# Patient Record
Sex: Male | Born: 1965 | Hispanic: No | Marital: Married | Smoking: Never smoker
Health system: Southern US, Community
[De-identification: ages and names within clinical notes are randomized; demographics above are authoritative.]

## PROBLEM LIST (undated history)

## (undated) DIAGNOSIS — F329 Major depressive disorder, single episode, unspecified: Secondary | ICD-10-CM

## (undated) DIAGNOSIS — F32A Depression, unspecified: Secondary | ICD-10-CM

## (undated) HISTORY — DX: Depression, unspecified: F32.A

## (undated) HISTORY — DX: Major depressive disorder, single episode, unspecified: F32.9

---

## 1999-12-21 ENCOUNTER — Encounter: Payer: Self-pay | Admitting: Emergency Medicine

## 1999-12-21 ENCOUNTER — Emergency Department (HOSPITAL_COMMUNITY): Admission: EM | Admit: 1999-12-21 | Discharge: 1999-12-21 | Payer: Self-pay | Admitting: Emergency Medicine

## 2002-11-21 ENCOUNTER — Emergency Department (HOSPITAL_COMMUNITY): Admission: EM | Admit: 2002-11-21 | Discharge: 2002-11-21 | Payer: Self-pay | Admitting: Emergency Medicine

## 2006-10-25 ENCOUNTER — Emergency Department (HOSPITAL_COMMUNITY): Admission: EM | Admit: 2006-10-25 | Discharge: 2006-10-25 | Payer: Self-pay | Admitting: Emergency Medicine

## 2006-11-05 ENCOUNTER — Ambulatory Visit (HOSPITAL_COMMUNITY): Admission: RE | Admit: 2006-11-05 | Discharge: 2006-11-05 | Payer: Self-pay | Admitting: Chiropractic Medicine

## 2007-10-21 ENCOUNTER — Emergency Department (HOSPITAL_COMMUNITY): Admission: EM | Admit: 2007-10-21 | Discharge: 2007-10-21 | Payer: Self-pay | Admitting: Emergency Medicine

## 2010-05-16 ENCOUNTER — Ambulatory Visit (INDEPENDENT_AMBULATORY_CARE_PROVIDER_SITE_OTHER): Payer: Self-pay | Admitting: Family Medicine

## 2010-05-16 ENCOUNTER — Encounter: Payer: Self-pay | Admitting: Family Medicine

## 2010-05-16 ENCOUNTER — Other Ambulatory Visit: Payer: Self-pay | Admitting: Family Medicine

## 2010-05-16 DIAGNOSIS — D229 Melanocytic nevi, unspecified: Secondary | ICD-10-CM | POA: Insufficient documentation

## 2010-05-16 DIAGNOSIS — F32A Depression, unspecified: Secondary | ICD-10-CM | POA: Insufficient documentation

## 2010-05-16 DIAGNOSIS — Z0000 Encounter for general adult medical examination without abnormal findings: Secondary | ICD-10-CM

## 2010-05-16 DIAGNOSIS — F329 Major depressive disorder, single episode, unspecified: Secondary | ICD-10-CM

## 2010-05-16 DIAGNOSIS — D492 Neoplasm of unspecified behavior of bone, soft tissue, and skin: Secondary | ICD-10-CM

## 2010-05-16 DIAGNOSIS — K029 Dental caries, unspecified: Secondary | ICD-10-CM | POA: Insufficient documentation

## 2010-05-16 NOTE — Assessment & Plan Note (Signed)
Suspicious mole on back.   Procedure note:   After discussing risks/ benefits of punch biopsy of mole, patient gave verbal and written consent to proceed.  A 4mm punch biopsy was obtained using 1% lidocaine with epinephrine, area prepped in clean fashion.  The 4mm punch was applied, specimen placed in formalin.  Excellent hemostasis and cosmesis.  Dressed with triple antibiotic and patient given after-care instructions.  Tolerated procedure well.  Will follow up with him by phone with results when they are available.
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
## 2010-05-16 NOTE — Assessment & Plan Note (Signed)
Given contact info for free dental clinic to be held in [place] next month.  Alternatively, he needs to apply for [employer] and get enrolled in dental clinic through [facility].

## 2010-05-16 NOTE — Patient Instructions (Signed)
Fue un placer conocerle como paciente hoy!  Por favor, vuelva en algun dia de la semana que viene para hacerse los laboratorios en ayunas (despues de 8 horas sin ni comer ni tomar nada).  Saque' una biopsia de la piel hoy; estoy mandandola para Chiropractor, despues le hago contacto con los [employer].  Informacion sobre la salud mental: [facility] [place] [phone], tienen atencion en espanol  Para atencion dental, hay una clinica gratuita en la [facility] [address], [place], [place] [address]  Quiero que haga una cita conmigo en 4 a 6 semanas.  MAKE FOLLOW UP APPT IN 4 TO 6 WEEKS WITH DR Mauricio Po

## 2010-05-20 ENCOUNTER — Telehealth: Payer: Self-pay | Admitting: Family Medicine

## 2010-05-20 NOTE — Telephone Encounter (Signed)
Called home, referred to new work #. Left message with Naval architect Fayrene Fearing.  I wish to tell pt that the skin biopsy shows some atypia, would like to do a more extensive excision of the mole.

## 2010-05-22 ENCOUNTER — Telehealth: Payer: Self-pay | Admitting: Family Medicine

## 2010-05-22 ENCOUNTER — Other Ambulatory Visit: Payer: Self-pay

## 2010-05-22 ENCOUNTER — Encounter: Payer: Self-pay | Admitting: Family Medicine

## 2010-05-22 NOTE — Telephone Encounter (Signed)
Attempted to call patient at home number to discuss abnormal skin biopsy results.  Automated message that "customer is unavailable", unable to leave message.  Will send letter informing patient that he needs wide-excision in the office due to atypia on punch bx.

## 2010-05-23 ENCOUNTER — Other Ambulatory Visit: Payer: Self-pay | Admitting: *Deleted

## 2010-05-23 ENCOUNTER — Other Ambulatory Visit: Payer: Self-pay

## 2010-05-23 DIAGNOSIS — Z0000 Encounter for general adult medical examination without abnormal findings: Secondary | ICD-10-CM

## 2010-05-23 DIAGNOSIS — F329 Major depressive disorder, single episode, unspecified: Secondary | ICD-10-CM

## 2010-05-23 LAB — COMPREHENSIVE METABOLIC PANEL
Albumin: 4.5 g/dL (ref 3.5–5.2)
BUN: 14 mg/dL (ref 6–23)
CO2: 26 mEq/L (ref 19–32)
Glucose, Bld: 95 mg/dL (ref 70–99)
Potassium: 4.4 mEq/L (ref 3.5–5.3)
Sodium: 139 mEq/L (ref 135–145)
Total Bilirubin: 0.7 mg/dL (ref 0.3–1.2)
Total Protein: 7.4 g/dL (ref 6.0–8.3)

## 2010-05-23 LAB — CONVERTED CEMR LAB
AST: 21 units/L (ref 0–37)
Alkaline Phosphatase: 68 units/L (ref 39–117)
BUN: 14 mg/dL (ref 6–23)
Creatinine, Ser: 0.74 mg/dL (ref 0.40–1.50)
HCT: 42.9 % (ref 39.0–52.0)
HDL: 46 mg/dL (ref >39)
Hemoglobin: 14.7 g/dL (ref 13.0–17.0)
LDL Cholesterol: 137 mg/dL — ABNORMAL HIGH (ref 0–99)
MCHC: 34.3 g/dL (ref 30.0–36.0)
RDW: 13.7 % (ref 11.5–15.5)
TSH: 1.025 microintl units/mL (ref 0.350–4.500)
Total Bilirubin: 0.7 mg/dL (ref 0.3–1.2)
Total CHOL/HDL Ratio: 4.6
VLDL: 27 mg/dL (ref 0–40)

## 2010-05-23 LAB — CBC
HCT: 42.9 % (ref 39.0–52.0)
Hemoglobin: 14.7 g/dL (ref 13.0–17.0)
MCHC: 34.3 g/dL (ref 30.0–36.0)
MCV: 84.1 fL (ref 78.0–100.0)

## 2010-05-23 LAB — LIPID PANEL
Cholesterol: 210 mg/dL — ABNORMAL HIGH (ref 0–200)
HDL: 46 mg/dL (ref >39)
Triglycerides: 133 mg/dL (ref <150)

## 2010-06-12 ENCOUNTER — Telehealth: Payer: Self-pay | Admitting: Family Medicine

## 2010-06-12 NOTE — Telephone Encounter (Signed)
Patient gives verbal permission to discuss his health record with his wife, Darryl Wang.  He works in [employer] and is not allowed to receive phone calls at work.  Best way to reach is to call home phone and discuss with wife.

## 2010-06-12 NOTE — Telephone Encounter (Signed)
Patient seen in office while accompanying wife in [facility].  Conversation in [place].  Discussed lab results, also discussed atypia on punch biopsy and need for excisional biopsy with margins.  He is concerned about cost, has not gone for [employer] card yet, plans to go tomorrow and then schedule for me on the week of March 26th.  I have informed him that I will be away for the first 2 weeks of April, may schedule with another physician during that time if he cannot come in during the last week of March.

## 2010-06-13 ENCOUNTER — Ambulatory Visit: Payer: Self-pay | Admitting: Family Medicine

## 2010-06-27 ENCOUNTER — Encounter: Payer: Self-pay | Admitting: Family Medicine

## 2010-06-27 ENCOUNTER — Ambulatory Visit (INDEPENDENT_AMBULATORY_CARE_PROVIDER_SITE_OTHER): Payer: Self-pay | Admitting: Family Medicine

## 2010-06-27 ENCOUNTER — Other Ambulatory Visit: Payer: Self-pay | Admitting: Family Medicine

## 2010-06-27 VITALS — BP 118/78 | HR 68 | Wt 146.0 lb

## 2010-06-27 DIAGNOSIS — D492 Neoplasm of unspecified behavior of bone, soft tissue, and skin: Secondary | ICD-10-CM

## 2010-06-27 DIAGNOSIS — D229 Melanocytic nevi, unspecified: Secondary | ICD-10-CM

## 2010-06-27 NOTE — Progress Notes (Signed)
  Subjective:    Patient ID: Darryl Wang, male    DOB: 1965-07-10, 45 y.o.   MRN: [id number]  HPI Visit conducted in Spanish. Patient and wife present.  Discussed results of last punch biopsy, which has healed nicely.  Discussed reasons for recommending full excision with elliptical excision today.     Review of Systems  Allergic to PCN only.  No latex or other allergy.  No blood dyscrasia or coagulopathy.    Objective:   Physical Exam Well appearing, no apparent distress.  SKIN: healed area of punch biopsy on upper back, to R of midline.  Healed scar, surrounded by small area of hyperpigmentation and erythema.         Assessment & Plan:

## 2010-06-27 NOTE — Patient Instructions (Signed)
Fue un placer verle hoy para la biopsia del lunar en la espalda.  El procedimiento salio' bien.   Es importante que vuelva en 10 a 14 dias con la enfermera para quitarle las 5 puntadas en la espalda.   Por favor no se quite la benda hasta manana por la tarde.  No se moje ni se bane antes de dejar pasar las 24 horas.   Puede dejar el area abierta si esta' seco.   Llame en seguida si ve purulencia, si esta' muy rojizo o [employer], o si tiene calenturas o fiebres.   Puede volver al Aleen Campi de nuevo el domingo (1 de abril).  Como no voy a Investment banker, corporate oficina por 2 semanas, pido que llame en 10 dias para saber los Baker.  De otra forma, cuando yo regrese, le llamo con los [place].

## 2010-06-27 NOTE — Assessment & Plan Note (Addendum)
Procedure Note: Discussed risks/benefits with patient, wife.  Patient agrees to proceed after given chance to ask  Questions.   2% lido with epinephrine applied to area.  Markings made at site of elliptical incision.  Area prepped in sterile fashion.   11-blade used to make a 1.25cm by 8mm elliptical incision conforming to anatomic skin contour lines.  EBL 0.  4-0 Vicryl suture, 5 interrupted sutures to close.  Five (5) sutures placed along length of the incision site, with good closure and no dog ears.  Dressed with triple antibiotic ointment and gauze.  After care instructions given.  For suture removal in 10-14 days by RN clinic.  Patient instructed to call for results after 1 week, or else I will call patient's house with results when I return from my leave.

## 2010-07-02 ENCOUNTER — Ambulatory Visit (INDEPENDENT_AMBULATORY_CARE_PROVIDER_SITE_OTHER): Payer: Self-pay | Admitting: Family Medicine

## 2010-07-02 VITALS — BP 98/62 | Temp 98.1°F | Wt 140.0 lb

## 2010-07-02 DIAGNOSIS — T8130XA Disruption of wound, unspecified, initial encounter: Secondary | ICD-10-CM | POA: Insufficient documentation

## 2010-07-02 DIAGNOSIS — T8131XA Disruption of external operation (surgical) wound, not elsewhere classified, initial encounter: Secondary | ICD-10-CM

## 2010-07-02 NOTE — Progress Notes (Signed)
Darryl Wang Had a biopsy of his upper back this Friday. This week his stitches came out in the inferior portion of his wound. His wife cleaned the wound and applied a steri-strip and a dressing. He is doing well. No drainage or surrounding erythremia. No fever.   ROS: As above.  Exam: VS reviewed.  Skin: Back: Small 1 cm incision on upper right back. Top 4 mm has black sutures in place. Inferior portion partially dehisced with steri strip overlaying. Wound is healing. No surrounding erythremia. Not tender. No drainage.

## 2010-07-02 NOTE — Assessment & Plan Note (Signed)
Minor. Wife is doing a great job with management.  Plan: Reassurance and warning signs given. Feel trying to replace sutures at this time would only create more of a scar and greater chance of infection. Continue steri strip as needed. Will f/u in 1 week for removal of rest of the sutures.

## 2010-07-10 ENCOUNTER — Ambulatory Visit: Payer: Self-pay | Admitting: *Deleted

## 2010-07-10 DIAGNOSIS — Z4802 Encounter for removal of sutures: Secondary | ICD-10-CM

## 2010-07-10 NOTE — Progress Notes (Signed)
In for suture removal.  Three sutures removed. Wound completely healed. No redness or drainage noted. Area cleaned with alcohol and antibiotic ointment applied where sutures were removed. covered with bandaid.

## 2010-07-11 NOTE — Progress Notes (Deleted)
In for suture removal.  Three sutures removed. Wound completely healed. No redness or drainage noted. Area cleaned with alcohol and antibiotic ointment applied where sutures were removed. covered with bandaid. 

## 2010-07-14 ENCOUNTER — Telehealth: Payer: Self-pay | Admitting: Family Medicine

## 2010-07-14 NOTE — Telephone Encounter (Signed)
Call completed in Spanish.  Spoke with wife Cicero Duck, as patient has given me permission to discuss with her (see prior office notes) and patient is not available by phone at this time.   Informed Cicero Duck of results of skin biopsy and clean margins; discussed dehiscence of wound and that now it appears clean, no suppuration, not painful.   Bekim continues to remark about "calambres" (cramps) in his chest from time to time.  Reviewed prior office notes and labs with wife on phone, asked that they make appt in the coming 2 months to discuss more in-depth.  (I would like to discuss his symptoms in more detail, bearing in mind his history of prior depressive episodes and use of SSRIs in the past).  Cicero Duck asks that a copy of the biopsy result be mailed to their home.

## 2011-01-12 LAB — I-STAT 8, (EC8 V) (CONVERTED LAB)
BUN: 14
Bicarbonate: 25.1 — ABNORMAL HIGH
Glucose, Bld: 95
Hemoglobin: 14.6
Sodium: 140
pCO2, Ven: 37.3 — ABNORMAL LOW

## 2011-07-03 ENCOUNTER — Encounter: Payer: Self-pay | Admitting: Sports Medicine

## 2011-07-03 ENCOUNTER — Ambulatory Visit (INDEPENDENT_AMBULATORY_CARE_PROVIDER_SITE_OTHER): Payer: Self-pay | Admitting: Sports Medicine

## 2011-07-03 VITALS — BP 115/79 | HR 66 | Temp 98.3°F | Wt 139.9 lb

## 2011-07-03 DIAGNOSIS — R1013 Epigastric pain: Secondary | ICD-10-CM | POA: Insufficient documentation

## 2011-07-03 DIAGNOSIS — F419 Anxiety disorder, unspecified: Secondary | ICD-10-CM

## 2011-07-03 DIAGNOSIS — F329 Major depressive disorder, single episode, unspecified: Secondary | ICD-10-CM

## 2011-07-03 DIAGNOSIS — F411 Generalized anxiety disorder: Secondary | ICD-10-CM | POA: Insufficient documentation

## 2011-07-03 MED ORDER — FLUOXETINE HCL 20 MG PO TABS: 20.0000 mg | ORAL_TABLET | Freq: Every day | ORAL | Status: DC

## 2011-07-03 NOTE — Assessment & Plan Note (Signed)
Pain associated with his anxiety is higher.  No associated signs or symptoms of infection, or obstruction.  No hematochezia, no melena.  No nausea or vomiting.  One episode of diarrhea, no constipation.  No weight gain or weight loss  Differential diagnoses #1 anxiety.  See anxiety section for treatment plan. Patient not approved with SSRI.  Consider imaging for constipation or starting PPI for questionable reflux like symptoms although patient denies any at this time.

## 2011-07-03 NOTE — Assessment & Plan Note (Addendum)
Starting SSRI for treatment of both anxiety and depression. Reviewed red flags and increased suicidality. No SI HI on exam today.
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
## 2011-07-03 NOTE — Patient Instructions (Signed)
It was nice to see you today.  I believe that your anxiety is leading to your abdominal pain.    Today we discussed:   You abdominal pain and anxiety:  I prescription for Prozac.  This is a medicine to take daily that'll help prevent these symptoms from occurring.  I would expect you to start feeling better by the beginning of next week.  It is also very important that she speak with a driver the car and have them slowed down for not only causing anxiety but also for your overall safety.    Dealing with the stresses in your life is the better treatment however the medicine should help.  I also recommend that you try to find somebody to talk with regarding your anxiety.  This can be a close friend, a family member, priest, or any Control and instrumentation engineer.   I will have few follow up with Dr. Mauricio Po later next week to see how you're doing with this medicine.   If you notice you're having any thoughts of hurting herself or hurting others please stop the medicine and call her emergency 24-hour line or go to your local emergency department   If you need anything prior to seeing me please call the clinic.  Remember we have a 24 hour emergency line if you need to contact us in the event of an emergency or if you are unsure if you need to be evaluated in the Emergency Department or if your issue can wait until the our clinic opens in the morning.  Please call our office [phone]) and follow the instructions to reach our paging service.  If you have a life or limb threatening emergency, proceed to the [facility] Emergency Department or call 911.

## 2011-07-10 ENCOUNTER — Encounter: Payer: Self-pay | Admitting: Family Medicine

## 2011-07-10 ENCOUNTER — Ambulatory Visit (INDEPENDENT_AMBULATORY_CARE_PROVIDER_SITE_OTHER): Payer: Self-pay | Admitting: Family Medicine

## 2011-07-10 VITALS — BP 121/74 | HR 73 | Ht 65.0 in | Wt 140.0 lb

## 2011-07-10 DIAGNOSIS — R1013 Epigastric pain: Secondary | ICD-10-CM

## 2011-07-10 DIAGNOSIS — F411 Generalized anxiety disorder: Secondary | ICD-10-CM

## 2011-07-10 DIAGNOSIS — Z23 Encounter for immunization: Secondary | ICD-10-CM

## 2011-07-10 DIAGNOSIS — F419 Anxiety disorder, unspecified: Secondary | ICD-10-CM

## 2011-07-10 NOTE — Assessment & Plan Note (Signed)
Situational anxiety with a possible base of generalized anxiety disorder.   Doing well on fluoxetine. No changes plan. Followup with primary care doctor in one month.

## 2011-07-10 NOTE — Patient Instructions (Signed)
Thank you for coming in today. Please start Omeprazole (prilosec) daily.  Please continue the fluoxetine.  See Dr. Mauricio Po in 1 month or so.   Acidez   (Heartburn)  La acidez es una sensacin de dolor y Therapist, music en el pecho. Puede empeorar en ciertas posiciones, como estando acostado o inclinado hacia adelante. Se produce cuando el cido del estmago vuelve al conducto por el que bajan los alimentos desde la boca al estmago (esfago inferior).   CAUSAS    Comidas abundantes.   Ciertos alimentos y bebidas.   La prctica de ejercicios.   Aumento en la produccin de cido .   Tener sobrepeso o ser obeso.   Ciertos medicamentos.  SNTOMAS    Sensacin de ardor en el pecho o en la parte inferior de la garganta.   Gusto amargo en la boca.   Tos.  DIAGNSTICO   Si los tratamientos habituales no lo mejoran, habr que realizar estudios para ver si se trata de otra enfermedad. Las pruebas habituales son:    Radiografas.   Endoscopa. En este procedimiento se [place] un tubo con Neomia Dear luz y una cmara en un extremo, y se examina el esfago y Investment banker, corporate.   Un anlisis para medir la cantidad de cido en el estmago (prueba de PH).   Prueba para ver si el esfago funciona adecuadamente (manometra esofgica).   Anlisis de [place], de la respiracin o de materia fecal para ver si hay una bacteria que produce las lceras.  TRATAMIENTO    El mdico aconsejar sobre el uso de medicamentos de venta libre (anticidos, medicamentos para disminuir la Engineering geologist) en los casos de sntomas leves.   El mdico indicar medicamentos para disminuir el cido estomacal o para proteger la superficie del [place].   El profesional indicar un cambio en la dieta.   En casos graves, el mdico recomendar que eleve la cabecera de la cama con bloques. (Dormir con ms almohadas no es Manufacturing systems engineer, ya que slo modifica la posicin de la cabeza y no mejora el problema principal del reflujo cido del  estmago al esfago.)  INSTRUCCIONES PARA EL CUIDADO EN EL [employer]    Tome todos los medicamentos segn le indic su mdico.   Eleve la cabecera de la cama colocando bloques debajo de las patas, si el mdico lo aconsej.   No haga ejercicios enseguida despus de comer.   Evite comer 2  3 horas antes de ir a dormir. No se acueste enseguida despus de comer.   Haga comidas pequeas durante Glass blower/designer de 3 comidas abundantes.   Si fuma, abandone el hbito.   Mantenga un peso saludable.   Identifique los alimentos o las bebidas que empeoran sus sntomas y evtelos. Los alimentos que debe evitar son:   [place].   Chocolate.   Alimentos con alto contenido de grasas, incluyendo las comidas fritas.   Comidas muy condimentadas.   Ajo y cebolla.   Ctricos, como naranja, pomelo, limn y lima.   Alimentos o productos que [employer].   Menta.   Bebidas carbonatadas, que contengan cafena y alcohol.   V[place]agre.  SOLICITE ATENCIN MDICA DE INMEDIATO SI:    Siente un dolor intenso en el pecho que baja por el brazo o va hacia al mandbula o el cuello.   Se siente mareado o sufre un desmayo.   Comienza a sentir falta de aire.   Vomita sangre.   Tiene dificultad o dolor al tragar.   La materia fecal (  heces) es negra, de aspecto alquitranado.   Tiene acidez ms de 3 veces por semana, durante ms de 2 semanas.  ASEGRESE DE QUE:    Comprende estas instrucciones.   Controlar su enfermedad.   Solicitar ayuda de inmediato si no mejora o si empeora.  Document Released: 11/26/2010 Document Revised: 03/05/2011 [facility] Patient Information 2012 [place], [place].

## 2011-07-10 NOTE — Assessment & Plan Note (Signed)
I am not sure of the cause of his abdominal pain. Quick review of his past history does show some workup in the past which no clear diagnosis. I am suspicious for heartburn or biliary colic.  His abdominal exam is significant for some very mild upper right quadrant abdominal pain. His symptoms of burning discomfort make me think gastritis or heartburn if possible. Plan to treat empirically with omeprazole for one month and have patient followup with his primary care doctor
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
## 2011-07-10 NOTE — Progress Notes (Signed)
Addended byArlyss Repress on: 07/10/2011 03:06 PM   Modules accepted: Orders

## 2011-08-21 ENCOUNTER — Ambulatory Visit: Payer: Self-pay | Admitting: Family Medicine

## 2011-08-28 ENCOUNTER — Encounter: Payer: Self-pay | Admitting: Family Medicine

## 2011-08-28 ENCOUNTER — Telehealth: Payer: Self-pay | Admitting: Family Medicine

## 2011-08-28 ENCOUNTER — Ambulatory Visit (INDEPENDENT_AMBULATORY_CARE_PROVIDER_SITE_OTHER): Payer: Self-pay | Admitting: Family Medicine

## 2011-08-28 VITALS — BP 109/74 | HR 57 | Temp 98.2°F | Ht 65.0 in | Wt 137.0 lb

## 2011-08-28 DIAGNOSIS — R894 Abnormal immunological findings in specimens from other organs, systems and tissues: Secondary | ICD-10-CM

## 2011-08-28 DIAGNOSIS — F329 Major depressive disorder, single episode, unspecified: Secondary | ICD-10-CM

## 2011-08-28 DIAGNOSIS — T8130XA Disruption of wound, unspecified, initial encounter: Secondary | ICD-10-CM

## 2011-08-28 DIAGNOSIS — R1013 Epigastric pain: Secondary | ICD-10-CM

## 2011-08-28 DIAGNOSIS — T8131XA Disruption of external operation (surgical) wound, not elsewhere classified, initial encounter: Secondary | ICD-10-CM

## 2011-08-28 DIAGNOSIS — R768 Other specified abnormal immunological findings in serum: Secondary | ICD-10-CM

## 2011-08-28 MED ORDER — METRONIDAZOLE 500 MG PO TABS: 500.0000 mg | ORAL_TABLET | Freq: Two times a day (BID) | ORAL | Status: DC

## 2011-08-28 MED ORDER — CLARITHROMYCIN 500 MG PO TABS: 500.0000 mg | ORAL_TABLET | Freq: Two times a day (BID) | ORAL | Status: DC

## 2011-08-28 MED ORDER — FLUOXETINE HCL 40 MG PO CAPS: 40.0000 mg | ORAL_CAPSULE | Freq: Every day | ORAL | Status: DC

## 2011-08-28 MED ORDER — OMEPRAZOLE 20 MG PO CPDR: 20.0000 mg | DELAYED_RELEASE_CAPSULE | Freq: Two times a day (BID) | ORAL | Status: DC

## 2011-08-28 NOTE — Assessment & Plan Note (Signed)
Patient with some improvement on fluoxetine.  Has been getting at [facility] for $6; will prescribe at [facility] for $4, increase from 20 to 40mg  daily and see back in 1 month. Consider PHQ9 at that time in Spanish.

## 2011-08-28 NOTE — Assessment & Plan Note (Signed)
Consider possibility of H pylori; says he had some improvement with prilosec 20mg  daily OTC which she stopped about 3 weeks ago.  For testing today for H pylori Ab, treatment with regimen that avoids PCN (hives): likely omeprazole 20mg  BID, metronidazole 500mg  bid, and clarithromycin 500mg  bid for 14 days, retest with urea breath test 1 month later. If test negative, will continue on prilosec 40mg  daily until follow up in 1 month.

## 2011-08-28 NOTE — Assessment & Plan Note (Signed)
Previous site of biopsy with thickening of skin; no sign of infection or abscess.  Monitor.  Topical mild-potency HC 1% OTC cream intermittently for up to 2 weeks if itch is bad.
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
## 2011-08-28 NOTE — Progress Notes (Signed)
Addended by: Barbaraann Barthel on: 08/28/2011 02:35 PM   Modules accepted: Orders

## 2011-08-28 NOTE — Telephone Encounter (Signed)
Dr. Mauricio Po notified and he will resend.  I called the [facility] in [place] on [address] and left message to cancel the RX that was sent to then by mistake.

## 2011-08-28 NOTE — Progress Notes (Signed)
Addended by: Barbaraann Barthel on: 08/28/2011 04:41 PM   Modules accepted: Orders

## 2011-08-28 NOTE — Patient Instructions (Signed)
Fue un Research officer, trade union.   Para el estomago, estamos haciendo una prueba para una bacteria que se llama de Helicobacter pylori (H pylori). Si la prueba Designer, multimedia, le voy a Camera operator 3 medicinas genericas, que se deben [employer] por dia, por 14 dias ([facility] [address]  En ese caso, pido que vuelva a hacer una prueba para asegurar de que se haya erradicado la bacteria en un mes despues de Environmental education officer.   Si la prueba de H pylori sale negativa hoy, quiero que vuelva a tomar la prilosec 40mg  por dia hasta qhe le vea en un mes.   Aumentamos la dosis de fluoxetine de 20 a 40mg  por dia.  Mande' la receta a [facility].   FOLLOW UP IN ONE MONTH WITH DR Mauricio Po

## 2011-08-28 NOTE — Telephone Encounter (Signed)
It looks like the medicine that was prescribed was erroneously sent  to a [facility] in [place] on [address] and it needs to go to [facility], in [place].

## 2011-09-01 ENCOUNTER — Telehealth: Payer: Self-pay | Admitting: Family Medicine

## 2011-09-01 MED ORDER — OMEPRAZOLE 20 MG PO CPDR: 20.0000 mg | DELAYED_RELEASE_CAPSULE | Freq: Two times a day (BID) | ORAL | Status: DC

## 2011-09-01 MED ORDER — CLARITHROMYCIN 500 MG PO TABS: 500.0000 mg | ORAL_TABLET | Freq: Two times a day (BID) | ORAL | Status: DC

## 2011-09-01 MED ORDER — METRONIDAZOLE 500 MG PO TABS: 500.0000 mg | ORAL_TABLET | Freq: Two times a day (BID) | ORAL | Status: AC

## 2011-09-01 NOTE — Telephone Encounter (Signed)
Dr. Mauricio Po stated that  the meds prescribed are the only ones indicated for  the condition he has. He will print out the meds and  patient can take to different pharmacies to see which are less expensive. Marines notified patient . He will pick up today.  RX placed in file in front office.

## 2011-09-01 NOTE — Telephone Encounter (Signed)
Will forward message to Dr.Breen. 

## 2011-09-01 NOTE — Telephone Encounter (Signed)
Pt states that he cannot afford the medicines prescribed except for the prozac.  Wants to speak with Dr Mauricio Po about this since he only speaks Spanish.

## 2011-09-25 ENCOUNTER — Encounter: Payer: Self-pay | Admitting: Family Medicine

## 2011-09-25 ENCOUNTER — Telehealth: Payer: Self-pay | Admitting: Family Medicine

## 2011-09-25 ENCOUNTER — Ambulatory Visit (INDEPENDENT_AMBULATORY_CARE_PROVIDER_SITE_OTHER): Payer: Self-pay | Admitting: Family Medicine

## 2011-09-25 VITALS — BP 133/76 | HR 117 | Ht 65.5 in | Wt 137.0 lb

## 2011-09-25 DIAGNOSIS — R1013 Epigastric pain: Secondary | ICD-10-CM

## 2011-09-25 DIAGNOSIS — F329 Major depressive disorder, single episode, unspecified: Secondary | ICD-10-CM

## 2011-09-25 DIAGNOSIS — R894 Abnormal immunological findings in specimens from other organs, systems and tissues: Secondary | ICD-10-CM

## 2011-09-25 DIAGNOSIS — R768 Other specified abnormal immunological findings in serum: Secondary | ICD-10-CM

## 2011-09-25 MED ORDER — BUPROPION HCL ER (XL) 150 MG PO TB24: 150.0000 mg | ORAL_TABLET | Freq: Two times a day (BID) | ORAL | Status: DC

## 2011-09-25 MED ORDER — CITALOPRAM HYDROBROMIDE 20 MG PO TABS: 20.0000 mg | ORAL_TABLET | Freq: Every day | ORAL | Status: DC

## 2011-09-25 NOTE — Assessment & Plan Note (Signed)
Seems to have gotten better with treatment for H pylori.  Discussed that he should not use prilosec while we're waiting for TOC in 4 weeks.  He needs to come back for lab in 4 weeks, discussed nothing to eat or drink for 1 hr before the test.  May use H2blockers as needed until then.

## 2011-09-25 NOTE — Assessment & Plan Note (Signed)
Depression appears much improved, however will plan to change medicaiton due to reduced libido.  Would like to change to bupropion; had given a Rx and sent to [facility] pharmacy; patient called back to say they did not carry this med. Will try another $4 SSRI and see back in 2 to 4 weeks.  OF note, patient plans to see counselor from [facility] the [place], [place].  He admits to having had brief thoughts of self-harm, but states that he had no plan and still has no plan.  Denies auditory or visual hallucinations.  Does not have firearms in the house.

## 2011-09-25 NOTE — Telephone Encounter (Signed)
Call to patient, completed in Spanish. He states that the bupropion was beyond his ability to pay; [facility] does not have this med on their formulary.  Will change to citalopram one time daily, reviewed instructions with him over the phone, he voices understanding. Follow up in 2 to 4 weeks or sooner as needed.  Paula Compton, MD

## 2011-09-25 NOTE — Telephone Encounter (Signed)
Pt's wife called to let Dr. Mauricio Po know that they went by the [facility] to p/u meds that were prescribed and they don't have it there. And they checked around to other pharmacies and it was to expensive. Was told to call Dr. Mauricio Po back.Loralee Pacas Reddick

## 2011-09-25 NOTE — Progress Notes (Signed)
  Subjective:    Patient ID: Darryl Wang, male    DOB: September 05, 1965, 46 y.o.   MRN: [id number]  HPI Visit conducted in Spanish. Wife present for part of visit, interview alone with patient.  Avari reports that he has had some noticeable improvement in abd pain since completing the triple H pylori therapy earlier this week.  Still some epigastric discomfort.  Nottaking PPI any longer.  SOme mildly loose stool ("not diarrhea") since taking the treatment. NO blood in stool   Feels much better regarding depressive symptoms.  More relaxed. Better able to function.  More energy.  Does disclose 1:1 with me that he has reduced libido, and this is interfering with marital relationship.  Started when he started on the prozac.  No ED.  Still able to achieve an erection and function sexually, but interest is much less.    Review of Systems See hpi    Objective:   Physical Exam Alert, well appearing, no apparent distress.  Affect bright.  ABD Soft, nontender, nondistended.       Assessment & Plan:

## 2011-09-25 NOTE — Patient Instructions (Addendum)
Fue un Research officer, trade union. Estoy cambiando la medicina antidepresiva que esta' tomando:  Descontinue la Fluoxetina. Comience a tomar la bupropion ER 150mg , una tableta por dia, por una semana.  Despues, tome 1 tableta 2 veces por dia.   Le estoy dando la receta en papel, para llevarla al [facility] ([address])  Quiero verle en 4 semanas, en el tiempo en que vamos a hacer la prueba para ver si se erradico' la bacteria H pylori.

## 2011-10-02 MED ORDER — PAROXETINE HCL 10 MG PO TABS: 10.0000 mg | ORAL_TABLET | ORAL | Status: DC

## 2011-10-02 NOTE — Telephone Encounter (Signed)
Marines spoke with  patient and he states the citalopram was too expensive $ 74.00 at [facility]. I called [facility].Marland Kitchen [facility] does not have. They only have generic Paxil. Will send message to Dr. Mauricio Po to advise.

## 2011-10-02 NOTE — Telephone Encounter (Signed)
I would prefer that the patient be seen in followup in approx one month (end-July or early Aug), not wait until Aug 30th. Darryl Wang

## 2011-10-02 NOTE — Telephone Encounter (Signed)
appt was rsc for 08/09 @9 :15am   Marines

## 2011-10-02 NOTE — Addendum Note (Signed)
Addended by: Barbaraann Barthel on: 10/02/2011 09:32 AM   Modules accepted: Orders

## 2011-10-02 NOTE — Telephone Encounter (Signed)
Pt will go to pick it up medicine at [facility] and pt next appt is 08/30.  Marines

## 2011-10-02 NOTE — Telephone Encounter (Signed)
New Rx for paroxetine 10mg  tablets, take 1 tablet daily, sent to [facility].    Marines, please call patient to explain that I have cancelled the citalopram and changed to paroxetine 10mg  tablets once daily.  May increase dose as needed at next office follow up in the coming month.   Paula Compton, MD

## 2011-11-06 ENCOUNTER — Ambulatory Visit (INDEPENDENT_AMBULATORY_CARE_PROVIDER_SITE_OTHER): Payer: Self-pay | Admitting: Family Medicine

## 2011-11-06 ENCOUNTER — Encounter: Payer: Self-pay | Admitting: Family Medicine

## 2011-11-06 ENCOUNTER — Other Ambulatory Visit: Payer: Self-pay | Admitting: Family Medicine

## 2011-11-06 VITALS — BP 110/72 | HR 62 | Ht 65.0 in | Wt 141.0 lb

## 2011-11-06 DIAGNOSIS — H6120 Impacted cerumen, unspecified ear: Secondary | ICD-10-CM

## 2011-11-06 DIAGNOSIS — R768 Other specified abnormal immunological findings in serum: Secondary | ICD-10-CM

## 2011-11-06 DIAGNOSIS — F329 Major depressive disorder, single episode, unspecified: Secondary | ICD-10-CM

## 2011-11-06 DIAGNOSIS — K602 Anal fissure, unspecified: Secondary | ICD-10-CM | POA: Insufficient documentation

## 2011-11-06 DIAGNOSIS — H6122 Impacted cerumen, left ear: Secondary | ICD-10-CM

## 2011-11-06 DIAGNOSIS — R894 Abnormal immunological findings in specimens from other organs, systems and tissues: Secondary | ICD-10-CM

## 2011-11-06 MED ORDER — FLUOXETINE HCL 40 MG PO CAPS: 40.0000 mg | ORAL_CAPSULE | Freq: Every day | ORAL | Status: DC

## 2011-11-06 NOTE — Assessment & Plan Note (Signed)
For TOC urea breath test today. GERD sx are resolved.

## 2011-11-06 NOTE — Progress Notes (Signed)
  Subjective:    Patient ID: Darryl Wang, male    DOB: [birth year], 46 y.o.   MRN: [id number]  HPI Visit conducted in Spanish.  Alias reports that his GERD symptoms are resolved. Completed treatment for H pylori, not using any tx for this now (no PPI, no bismuth).  Is fasting today. For H pylori TOC breath urea test.  Reports seeing small amt blood on tissue after BM. Considerable constipation. No surgical hx. No weight loss, no fevers/ chills or sweats.   Family Hx: no family hx CRC; brother and mother with colitis of some sort. Grandmother with uterine CA.    Review of Systems See HPI.  Reports loss hearing L ear for the past 1 week.     Objective:   Physical Exam  Well appearing, no apparent distress HEENT Neck supple. L TM obstructed by wax. R TM clear. No pain to manipulate tragus and pinna.  ABD Soft, nontender, nondistended. Audible bowel sounds. No masses or megalies.  RECTAL: anal fissure noted @6  o'clock. No ext or internal hemorrhoids. Prostate smooth and nontender. Non-nodular.       Assessment & Plan:

## 2011-11-06 NOTE — Patient Instructions (Addendum)
Me alegro que esta' mejor desde el punto de vista de la depresion.  Siga tomando la Prozac 40mg  capsulas una vez por dia.  Para el estrenimiento, quiero que tome una cucharada de las de sopa de FIBRA con PSYLLIUM TODOS LOS DIAS.  Tome [employer].  Quiero que se siente en la banadera con SALES DE EPSON que se pueden comprar sin receta en la farmacia.  Si no se mejora dentro de un mes, quiero que me vuelva a ver para Sports administrator.   FOLLOW UP IN 4 TO 6 WEEKS WITH DR Mauricio Po

## 2011-11-06 NOTE — Assessment & Plan Note (Signed)
Much improved.  Has continued taking prozac 40mg  daily, libido issues lifted. To continue with this.  Is doing well at work, got promotion.

## 2011-11-09 LAB — H. PYLORI BREATH TEST: H. pylori Breath Test: NEGATIVE

## 2011-11-27 ENCOUNTER — Ambulatory Visit: Payer: Self-pay | Admitting: Family Medicine

## 2011-12-18 ENCOUNTER — Ambulatory Visit: Payer: Self-pay | Admitting: Family Medicine

## 2012-02-02 ENCOUNTER — Encounter: Payer: Self-pay | Admitting: Family Medicine

## 2012-07-01 ENCOUNTER — Encounter: Payer: Self-pay | Admitting: Family Medicine

## 2012-07-01 ENCOUNTER — Ambulatory Visit (INDEPENDENT_AMBULATORY_CARE_PROVIDER_SITE_OTHER): Payer: No Typology Code available for payment source | Admitting: Family Medicine

## 2012-07-01 VITALS — BP 121/76 | HR 76 | Temp 97.4°F | Ht 65.0 in | Wt 151.0 lb

## 2012-07-01 DIAGNOSIS — F329 Major depressive disorder, single episode, unspecified: Secondary | ICD-10-CM

## 2012-07-01 DIAGNOSIS — E663 Overweight: Secondary | ICD-10-CM | POA: Insufficient documentation

## 2012-07-01 DIAGNOSIS — Z6825 Body mass index (BMI) 25.0-25.9, adult: Secondary | ICD-10-CM

## 2012-07-01 MED ORDER — FLUOXETINE HCL 60 MG PO TABS: 1.0000 | ORAL_TABLET | Freq: Every day | ORAL | Status: DC

## 2012-07-01 MED ORDER — FLUOXETINE HCL 20 MG PO CAPS: 60.0000 mg | ORAL_CAPSULE | Freq: Every day | ORAL | Status: DC

## 2012-07-01 NOTE — Addendum Note (Signed)
Addended by: Barbaraann Barthel on: 07/01/2012 03:40 PM   Modules accepted: Orders, Medications

## 2012-07-01 NOTE — Progress Notes (Signed)
  Subjective:    Patient ID: Darryl Wang, male    DOB: March 07, 1966, 47 y.o.   MRN: [id number]  HPI Visit in Spanish; his wife is present for this visit as well.  He presents for concern about worsening of his depression over the past 2 months.  Has continued to take his fluoxetine 40mg  daily, and to see his counselor Emeline Gins at [facility] the [place]).  He has had to reduce the frequency of his counseling sessions to once monthly from weekly visits due to unavailability of his counselor.  Describes some anxious eating, which has led to an increase in his weight by 10 lbs since last August.  His wife corroborates his anxious eating.  Poor sleeping, wakes up at night (no trouble initiating sleep). Wife says his snoring has gotten worse.  BMI today is 25.   Work situation is changing, which has him anxious.  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  Rest of history reviewed, unchanged.  Review of Systems No chest pain, no shortness of breath. No recent illnesses.  No episodes of mania or increased energy.      Objective:   Physical Exam Well appearing, no apparent distress HEENT Neck supple. Thyroid supple, and non-nodular.  Nontender COR Regular S1S2, no extra sounds PULM Clear bilaterally.        Assessment & Plan:

## 2012-07-01 NOTE — Assessment & Plan Note (Signed)
Recent weight gain, associated with anxious eating. Tends to choose food choices that are not healthy when he is stressed.  Plan to increase activity by playing soccer at [facility] [employer], where he recently obtained a membership.  For follow up in the coming 4 to 6 weeks.

## 2012-07-01 NOTE — Assessment & Plan Note (Addendum)
Depression control seems to be slipping, he attributes to anticipated changes in work and the reduction in frequency of his counseling sessions.  He does not have much in the way of physical activity these days.  Just got membership to [facility], and is planning on playing soccer there.  He looks forward to this.  Plan to increase his fluoxetine today to 60mg  daily, to return to office in 4-6 weeks or sooner to monitor progress.  Continue counseling as he is able.    PHQ9 score today is 6 (1 pt each for Q#1,2,3,4,6,9).

## 2012-07-01 NOTE — Patient Instructions (Addendum)
Fue un Research officer, trade union.  Le hago contacto con los resultados de los laboratorios que hicimos hoy.   Estoy ajustando la dosis de la medicina para la depresion (Fluoxetine) a una tableta de 60mg , tome una tableta por dia.  Quiero verle de nuevo en el consultorio en 4 a 6 semanas.  NO LA DESCONTINUE ABRUPTAMENTE, QUE PUEDE DAR EFECTOS SECUNDARIOS SI LA DESCONTINUA RAPIDAMENTE.

## 2012-07-02 LAB — CBC
HCT: 42 % (ref 39.0–52.0)
MCH: 28.5 pg (ref 26.0–34.0)
MCHC: 35.5 g/dL (ref 30.0–36.0)
MCV: 80.3 fL (ref 78.0–100.0)
RDW: 14.2 % (ref 11.5–15.5)

## 2012-07-02 LAB — COMPREHENSIVE METABOLIC PANEL
ALT: 41 U/L (ref 0–53)
AST: 29 U/L (ref 0–37)
Calcium: 9.7 mg/dL (ref 8.4–10.5)
Chloride: 102 mEq/L (ref 96–112)
Creat: 0.71 mg/dL (ref 0.50–1.35)

## 2012-07-02 LAB — TSH: TSH: 0.776 u[IU]/mL (ref 0.350–4.500)

## 2012-07-04 ENCOUNTER — Encounter: Payer: Self-pay | Admitting: Family Medicine

## 2012-08-05 ENCOUNTER — Encounter: Payer: Self-pay | Admitting: Family Medicine

## 2012-08-05 ENCOUNTER — Ambulatory Visit (INDEPENDENT_AMBULATORY_CARE_PROVIDER_SITE_OTHER): Payer: No Typology Code available for payment source | Admitting: Family Medicine

## 2012-08-05 VITALS — BP 116/78 | HR 64 | Ht 65.0 in | Wt 145.0 lb

## 2012-08-05 DIAGNOSIS — E663 Overweight: Secondary | ICD-10-CM

## 2012-08-05 DIAGNOSIS — H1045 Other chronic allergic conjunctivitis: Secondary | ICD-10-CM

## 2012-08-05 DIAGNOSIS — Z6825 Body mass index (BMI) 25.0-25.9, adult: Secondary | ICD-10-CM

## 2012-08-05 DIAGNOSIS — H1010 Acute atopic conjunctivitis, unspecified eye: Secondary | ICD-10-CM | POA: Insufficient documentation

## 2012-08-05 DIAGNOSIS — F3289 Other specified depressive episodes: Secondary | ICD-10-CM

## 2012-08-05 DIAGNOSIS — F329 Major depressive disorder, single episode, unspecified: Secondary | ICD-10-CM

## 2012-08-05 DIAGNOSIS — H1013 Acute atopic conjunctivitis, bilateral: Secondary | ICD-10-CM

## 2012-08-05 MED ORDER — FLUOXETINE HCL 40 MG PO CAPS: ORAL_CAPSULE | ORAL | Status: DC

## 2012-08-05 MED ORDER — FLUOXETINE HCL 20 MG PO CAPS: ORAL_CAPSULE | ORAL | Status: DC

## 2012-08-05 NOTE — Assessment & Plan Note (Signed)
Responding very well to increased dose of fluoxetine; also likely impacted by very positive lifestyle changes that are strengthening his marriage at the same time.  Long discussion about plan to continue on the medication (and with his exercise, counseling) for the foreseeable future, certainly at least until our next follow up visit in 6 months.  He is asked to call/come in if any changes that require timely evaluation or possible changes in our plan.  He and wife agree with this plan.

## 2012-08-05 NOTE — Assessment & Plan Note (Signed)
This problem is resolved, as patient's BMI today (08/05/12) is 24.

## 2012-08-05 NOTE — Progress Notes (Signed)
  Subjective:    Patient ID: Darryl Wang, male    DOB: 18-Mar-1966, 47 y.o.   MRN: [id number]  HPI Darryl Wang is here for follow up of depressive symptoms, accompanied by his wife.  Visit conducted in Spanish.  At last visit his fluoxetine dose was increased to 60mg /day.  He has noticed favorable response, improvements in anxiety and his overall outlook.  Not having negative thoughts or gloomy outlook. No thoughts of self-harm. Continues with counselor Emeline Gins at [facility] [place]) monthly, but would like more regularly-scheduled therapy than is possible at [facility].  Was made aware of a group called "[employer] [facility]" in [place] that provides mental health services in Spanish for low-cost or free.  Is considering exploring this.  Uncertainty in his work situation appears to have stabilized.  Is being given English classes with paid time to attend them at [employer] [facility] [employer] from his employer.  He is finding this beneficial.  He has started exercising regularly, using gym at [facility].  Is planning to begin playing soccer there as well; attends gym 3-4 times a week with his wife, and he is introducing her to soccer (she plans to play on the women's team).  They both attest that this is bringing them closer together and improving their outlook in general.  Regarding side effects, [name] noted some decreased libido at the start of taking fluoxetine and when dose increased, but this is no longer an issue.    Review of SystemsSee above.   Does have irritated eyes, and clear nasal secretion.  No fevers, no chills.  Some cough and sneeze.  Taking otc Zyrtec for this.      Objective:   Physical Exam Well appearing, no apparent distress.  HEENT Injected conjunctivae, PERRL.  Clear TMs.  Cobblestoning oropharynx.  Clear rhinorrhea.  No cervical adenopathy.  COR Regular S1S2 PULM Clear bilaterally. No rales or wheezes.        Assessment & Plan:

## 2012-08-05 NOTE — Patient Instructions (Addendum)
Me alegro que esta' mejor, y que los sintomas de la depresion se le han mejorado con la medicina y con el ejercicio.  Buena suerte con el futbol en junio!  Vamos a continuar la Fluoxetina 60mg  diario por ahora; por favor notifiqueme si hay algun cambio que le hacepensar que no esta' funcionando igual.   Puede seguir usando la Zyrtec sin-receta para las Environmental consultant.  Me gustaria que nos volvieramos a ver en 6 meses para ver como esta' todo.

## 2012-08-05 NOTE — Progress Notes (Signed)
  Subjective:    Patient ID: Darryl Wang, male    DOB: 1965-09-28, 47 y.o.   MRN: [id number]  HPI    Review of Systems     Objective:   Physical Exam  PHQ9 administered in Spanish, score is 5 (1 point each for Q#1-5).      Assessment & Plan:

## 2013-01-13 ENCOUNTER — Ambulatory Visit (INDEPENDENT_AMBULATORY_CARE_PROVIDER_SITE_OTHER): Payer: Self-pay

## 2013-01-13 DIAGNOSIS — Z23 Encounter for immunization: Secondary | ICD-10-CM

## 2013-02-10 ENCOUNTER — Ambulatory Visit: Payer: Self-pay | Admitting: Family Medicine

## 2013-04-28 ENCOUNTER — Ambulatory Visit: Payer: Self-pay | Admitting: Family Medicine

## 2013-04-28 ENCOUNTER — Ambulatory Visit (INDEPENDENT_AMBULATORY_CARE_PROVIDER_SITE_OTHER): Payer: No Typology Code available for payment source | Admitting: Family Medicine

## 2013-04-28 ENCOUNTER — Ambulatory Visit (HOSPITAL_COMMUNITY)
Admission: RE | Admit: 2013-04-28 | Discharge: 2013-04-28 | Disposition: A | Discharge status: Home / Self Care | Payer: No Typology Code available for payment source | Source: Ambulatory Visit | Attending: Family Medicine | Admitting: Family Medicine

## 2013-04-28 VITALS — BP 125/79 | HR 65 | Temp 98.0°F | Wt 144.9 lb

## 2013-04-28 DIAGNOSIS — F3289 Other specified depressive episodes: Secondary | ICD-10-CM

## 2013-04-28 DIAGNOSIS — F329 Major depressive disorder, single episode, unspecified: Secondary | ICD-10-CM

## 2013-04-28 DIAGNOSIS — R51 Headache: Secondary | ICD-10-CM

## 2013-04-28 DIAGNOSIS — R079 Chest pain, unspecified: Secondary | ICD-10-CM | POA: Insufficient documentation

## 2013-04-28 DIAGNOSIS — R519 Headache, unspecified: Secondary | ICD-10-CM | POA: Insufficient documentation

## 2013-04-28 DIAGNOSIS — F32A Depression, unspecified: Secondary | ICD-10-CM

## 2013-04-28 MED ORDER — CITALOPRAM HYDROBROMIDE 20 MG PO TABS: 20.0000 mg | ORAL_TABLET | Freq: Every day | ORAL | Status: DC

## 2013-04-28 NOTE — Progress Notes (Signed)
   Subjective:    Patient ID: Darryl Wang, male    DOB: 06-16-1965, 48 y.o.   MRN: [id number]  HPI  Visit conducted in [place].   Patient here accompanied by his wife, for complaint of headaches that have started over the past 3 weeks.  Occur across frontal area around to occipital region bilaterally, no photophobia, no nausea/vomiting, no associated motor weakness or disequilibrium, no vision changes. Does not wake from sleep.  Has not had an extensive headache history.  Relieves with advil (2 tablets), which he may take 2 or 3 times in a week.   Regarding depression, he continues with fluoxetine 60mg /day, has found himself getting more anxious recently.  Wife observes him to have low energy, low libido, sleeps more than usual (takes naps that can last for 2 to 4 hours).  Is continuing to go to work daily, not missing work.  Wife observes him to be depressed mood and he agrees with her assessment.   Never-smoker, no alcohol or other substances.   Review of Systems Reports no cough, fevers, chills, or bowel changes.  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  Not having this now; the chest complaint was not associated with diaphoresis, nausea or feeling of dread.     Objective:   Physical Exam Well appearing, no apparent distress. Fluent articulation and speech.  HEENT neck supple; no cervical adenopathy. No tenderness to palpate scalp or sinuses.  Normal nasal mucosa. TMs clear. FUndoscopic exam unremarkable, no papilledema. PERRL.  COR reg S1S2; tenderness over sternum with palpation.  PULM Clear bilaterally, no rales/        Assessment & Plan:

## 2013-04-28 NOTE — Assessment & Plan Note (Signed)
Depressive sxs not adequately controlled on Fluoxetine 60mg /day; plan to change to citalopram and revisit in 2 weeks.  Discussed possible side effects with patient and wife.  Will likely need to increase the dose of the citalopram at next visit.

## 2013-04-28 NOTE — Patient Instructions (Signed)
Fue un Civil Service fast streamer.  Quiero cambiar el tratamiento para la depresion a una medicina que creo que le va a ayudar con la depresion/ansieded y con los dolores de [place]. CITALOPRAM 20mg  UNA TABLETA UNA VEZ POR DIA>  SUSPENDA la fluoxetine ahora.   Quiero volver a verle en 2 a 3 semanas para re-evaluacion.   Puede tomar advil o tylenol para los dolores de [place].   FOLLOW UP WITH DR Lindell Noe IN 2 TO 3 WEEKS.

## 2013-05-12 ENCOUNTER — Encounter: Payer: Self-pay | Admitting: Family Medicine

## 2013-05-12 ENCOUNTER — Ambulatory Visit (INDEPENDENT_AMBULATORY_CARE_PROVIDER_SITE_OTHER): Payer: 59 | Admitting: Family Medicine

## 2013-05-12 ENCOUNTER — Telehealth: Payer: Self-pay | Admitting: Family Medicine

## 2013-05-12 VITALS — BP 122/69 | HR 66 | Ht 65.0 in | Wt 146.5 lb

## 2013-05-12 DIAGNOSIS — F3289 Other specified depressive episodes: Secondary | ICD-10-CM

## 2013-05-12 DIAGNOSIS — F32A Depression, unspecified: Secondary | ICD-10-CM

## 2013-05-12 DIAGNOSIS — M5416 Radiculopathy, lumbar region: Secondary | ICD-10-CM

## 2013-05-12 DIAGNOSIS — IMO0002 Reserved for concepts with insufficient information to code with codable children: Secondary | ICD-10-CM

## 2013-05-12 DIAGNOSIS — F329 Major depressive disorder, single episode, unspecified: Secondary | ICD-10-CM

## 2013-05-12 MED ORDER — CITALOPRAM HYDROBROMIDE 40 MG PO TABS: 40.0000 mg | ORAL_TABLET | Freq: Every day | ORAL | Status: DC

## 2013-05-12 NOTE — Patient Instructions (Signed)
Fue un Civil Service fast streamer.   Cambie' la receta para Citalopram de 20 a 40mg  por dia.   Quiero volver a verle en 4 a 6 semanas.  Si la espalda no esta' mejor, podemos considerar otra medicina que se llama de GABAPENTIN.   FOLLOW UP WITH DR Lindell Noe IN 4 TO 6 WEEKS FOR DEPRESSION, BACK PAIN.
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
## 2013-05-12 NOTE — Telephone Encounter (Signed)
Pt would like an referral for a dentist

## 2013-05-12 NOTE — Assessment & Plan Note (Signed)
Improved on citalopram; to increase dose and recheck in 4 weeks, recheck PHQ9 at that time.

## 2013-05-12 NOTE — Telephone Encounter (Signed)
Told patient no need for a referral to a dentist. She can call and make appt herself.  [name], [name], [name]

## 2013-06-09 ENCOUNTER — Encounter: Payer: Self-pay | Admitting: Family Medicine

## 2013-06-09 ENCOUNTER — Ambulatory Visit (INDEPENDENT_AMBULATORY_CARE_PROVIDER_SITE_OTHER): Payer: 59 | Admitting: Family Medicine

## 2013-06-09 VITALS — BP 128/76 | HR 61 | Temp 98.1°F | Ht 65.0 in | Wt 146.0 lb

## 2013-06-09 DIAGNOSIS — J309 Allergic rhinitis, unspecified: Secondary | ICD-10-CM

## 2013-06-09 DIAGNOSIS — F3289 Other specified depressive episodes: Secondary | ICD-10-CM

## 2013-06-09 DIAGNOSIS — F32A Depression, unspecified: Secondary | ICD-10-CM

## 2013-06-09 DIAGNOSIS — F329 Major depressive disorder, single episode, unspecified: Secondary | ICD-10-CM

## 2013-06-09 MED ORDER — NORTRIPTYLINE HCL 25 MG PO CAPS: 25.0000 mg | ORAL_CAPSULE | Freq: Every day | ORAL | Status: DC

## 2013-06-09 MED ORDER — LORATADINE 10 MG PO TABS: 10.0000 mg | ORAL_TABLET | Freq: Every day | ORAL | Status: DC

## 2013-06-09 NOTE — Assessment & Plan Note (Signed)
Patient did not tolerate citalopram at 40mg /day; the 20/day dose was mildly helpful but he still scores a 10 on PHQ9 and wouuld like a med change.  Reviewd prior meds for depression: fluoxetine was mildly helpful at low doses but not tolerated at higher doses; buproprion not well tolerated; paroxetine affected sexual function and was discontinued.   PHQ9: 2 pts each for Q#5,9; 1 pt each for Q#1,2,3,4,6,7.  Given associated sleep issues, to try nortripytline 25mg  at bedtime, discussed possible side effects and ways to mitigate these in the interim before our next visit in 2 to 3 weeks.  May plan to increase dose if tolerated.  Prescribing options limited by cost; the nortriptyline is sent to [facility] on [address] (listed as a $4 med there).  For follow up PHQ9 at next visit.

## 2013-06-09 NOTE — Patient Instructions (Signed)
Fue un Civil Service fast streamer.  Para la depresion, quiero cambiarle a [employer] de ayudarle con la depresion, ayuda tambien con el sueno.   Descontinue la Citalopram.  Comience la NORTRIPTYLINE 25mg , una capsula por boca cada noche, una hora antes de la hora de [place].  Si le da resaca por la [place], puede tomarsela 2 horas antes de la hora de dormir.  Para las Roberta, quiero que tome LORATADINE 10mg , una tableta cada [place].  Se puede comprar sin receta.   Quiero volver a verle en 2 a 3 semanas para ver como esta' todo con la depresion y la tos.   FOLLOW UP WITH DR Lindell Noe IN 2 TO 3 WEEKS DEPRESSION FOLLOWUP

## 2013-06-09 NOTE — Progress Notes (Signed)
   Subjective:    Patient ID: Darryl Wang, male    DOB: September 26, 1965, 48 y.o.   MRN: [id number]  HPI Visit conducted in [place].  Patient here to review response to antidepressant medication changes at last visit in Feb.  Had been on citalopram 20mg  and escalated to 40mg /day; he did not tolerate the dose change (felt irritable and "not himself" on higher dose) and be began to split the 40mg  pills in half and returned to 20mg /daily for the past couple of weeks.  Has returned to feeling better than on the 40/day dosing, but still with some mild irritability and insomnia.  Goes to sleep at 9pm and sleeps relatively well until around 1am, gets up to bathroom and then has trouble returning to sleep.  No sleep aids.  Wakes up at 4pm to get ready for work.  No naps during the day. No alcohol or drugs, nonsmoker.   PHQ9 score is a 10 today.  Scores 2 pts for Q#9, but on clarification says he feels "like I could just get up and leave, or drive my van until the gas runs out".  No thoughts or plan for self-harm.   Also reports a cough productive of clear sputum for the past 1 month; no blood.  No shortness of breath or chest pain. No fevers or chills, not feeling ill.  No paroxysms of cough.  No sick contacts. Zyrtec otc did not help much.  Initially with some clear rhinorrhea which is better now, only the cough remains.    ROS: See above. No history of asthma. Never a smoker. No sick contacts.  Review of Systems     Objective:   Physical Exam Well appearing, no apparent distress Animated affect; able to joke with me affably.  Neck supple. TMs with cerumen bilat. No sinus tenderness. Marked bluish hue to nasal mucosa, with clear watery discharge.  No cervical adenopathy. Clear oropharynx without exudates  PULM Clear bilaterally, no wheezes or rales. COR regular S1S2       Assessment & Plan:

## 2013-07-14 ENCOUNTER — Ambulatory Visit: Payer: 59 | Admitting: Family Medicine

## 2013-07-28 ENCOUNTER — Ambulatory Visit (INDEPENDENT_AMBULATORY_CARE_PROVIDER_SITE_OTHER): Payer: 59 | Admitting: Family Medicine

## 2013-07-28 ENCOUNTER — Encounter: Payer: Self-pay | Admitting: Family Medicine

## 2013-07-28 VITALS — BP 130/81 | HR 80 | Ht 65.0 in | Wt 151.0 lb

## 2013-07-28 DIAGNOSIS — F32A Depression, unspecified: Secondary | ICD-10-CM

## 2013-07-28 DIAGNOSIS — F3289 Other specified depressive episodes: Secondary | ICD-10-CM

## 2013-07-28 DIAGNOSIS — F329 Major depressive disorder, single episode, unspecified: Secondary | ICD-10-CM

## 2013-07-28 DIAGNOSIS — J309 Allergic rhinitis, unspecified: Secondary | ICD-10-CM

## 2013-07-28 MED ORDER — CETIRIZINE HCL 10 MG PO TABS: 10.0000 mg | ORAL_TABLET | Freq: Every day | ORAL | Status: DC

## 2013-07-28 MED ORDER — VENLAFAXINE HCL 75 MG PO TABS: 75.0000 mg | ORAL_TABLET | Freq: Two times a day (BID) | ORAL | Status: DC

## 2013-07-28 NOTE — Progress Notes (Signed)
   Subjective:    Patient ID: Darryl Wang, male    DOB: [birth year], 48 y.o.   MRN: [id number]  HPI Visit conducted in [place].  Patient here with wife, who offers history as well.  Patient and wife agree that Darryl Wang has not had much improvement with the nortriptyline 25mg /night.  Continues to be quite anxious.  He and wife are going to couples therapy (therapist named Cloyde Reams) at [facility].  Preparing for some very significant changes in how they relate to one another; wife voices concern that Darryl Wang feels 'defensive' and not allowing her to realize herself as an individual.  She says Darryl Wang is withdrawn and seems to be depressed.  He notes that he continues with poor sleep on most dates; some times he manages to get a good night's sleep. Denies dry mouth or urinary retention.  Recently obtained insurance, not limited to the $4 list as we have been in the past.   PHQ9 score 9 (2 pts for Q#5' 1 pt each for Q#1,2,3,4,6,7,9). Denies SI.     Review of Systems See HPI    Objective:   Physical Exam Alert, mildly flat affect.  No apparent distress HEENT Neck supple, no cervical adenopathy.         Assessment & Plan:

## 2013-07-28 NOTE — Assessment & Plan Note (Signed)
Major depressive disorder; not responded in the past to Citalopram, fluoxetine, paroxetine.  Not responding to low-dose nortriptyline.  Given the fact that he now has insurance, will change to a SNRI which is a class he has not tried previously.  He is a bit dejected by the lack of adequate response to previous medications, which have all been SSRI (with the exception of one low-dose TCA).  Follow up in 2 to 4 weeks; I have asked wife to be attentive to signs of worsening depression, in which case i would like him to stop the medicine and call me. Taper up from 75mg  to 150mg /day over the coming 2 weeks. Patient and wife agree to the plan.

## 2013-07-28 NOTE — Patient Instructions (Signed)
Fue un Civil Service fast streamer.  Estamos cambiando la medicina a Effexor (venlafaxine 75mg ); tome una tableta cada manana por la primera semana, despues aumente a 2 tabletas diarios (1 tableta por la manana y la segunda por la noche).  SUSPENDA LA NORTRIPTYLINE QUE ESTABA TOMANDO.  Hulan Saas' en 2 a 3 semanas, por favor llame ([phone]) sobretodo si hay que suspender la medicina nueva.   FOLLOW UP WITH DR Lindell Noe IN 4 TO 6 WEEKS.

## 2013-09-01 ENCOUNTER — Encounter: Payer: Self-pay | Admitting: Family Medicine

## 2013-09-01 ENCOUNTER — Ambulatory Visit (INDEPENDENT_AMBULATORY_CARE_PROVIDER_SITE_OTHER): Payer: 59 | Admitting: Family Medicine

## 2013-09-01 VITALS — BP 119/75 | HR 60 | Ht 65.0 in | Wt 152.0 lb

## 2013-09-01 DIAGNOSIS — F3289 Other specified depressive episodes: Secondary | ICD-10-CM

## 2013-09-01 DIAGNOSIS — F329 Major depressive disorder, single episode, unspecified: Secondary | ICD-10-CM

## 2013-09-01 DIAGNOSIS — F32A Depression, unspecified: Secondary | ICD-10-CM

## 2013-09-01 DIAGNOSIS — G5710 Meralgia paresthetica, unspecified lower limb: Secondary | ICD-10-CM

## 2013-09-01 DIAGNOSIS — G5712 Meralgia paresthetica, left lower limb: Secondary | ICD-10-CM | POA: Insufficient documentation

## 2013-09-01 NOTE — Patient Instructions (Signed)
Fue un Civil Service fast streamer.  Me alegro que esta' mejor con la nueva medicina (Effexor, venlafaxine).  Siga tomando 1 tableta dos veces por dia, con comida.   Si el hormigueo en el muslo izquierdo se empeora o si comienza a brotar una roncha, me avisa.  Quiero volver a verle en 6 a 8 semanas.  FOLLOW UP WITH DR Lindell Noe IN 6 to 8 WEEKS.

## 2013-09-01 NOTE — Progress Notes (Signed)
   Subjective:    Patient ID: Darryl Wang, male    DOB: 1966-01-24, 48 y.o.   MRN: [id number]  HPI  Visit in [place].  Patient presents here with his wife.  They both report that the patient seems somewhat better regarding his depression since last visit, when he was started on venlafaxine 75mg  twice daily.  Has felt less anxious. Wife says he is still somewhat irritable at times.  He complains of feeling very tired and worn down ("agotamiento"), can't fully rest.  He joined the [facility] for exercise, but this is not possible often because of the lack of time.  He gets up at 0430am and works 10-hour days, tries to dedicate some time each day to his children and family, and usually gets to bed aroudn 1030pm.  He believes he does best with 7 or 8 hours of sleep a night, is likely getting around 6 hours on a good night now.  He and wife are seeing a marriage counselor once weekly Cloyde Reams, from [facility]), which is helping.    Corin's PHQ9 today is an 8 (1 point each for all questions, except zero for Q#8).   Rayman concludes visit by remarking of sensation of tingling/pins and needles ("hormigueo") over the anterolateral aspect of the LEFT thigh. Has been ongoing intermittently for the past 1 month or so.  Not associated with weakness or falls, not radiating behind leg or down below the knee/popliteal space.     Review of Systems No fevers or chills; has continued with watery rhinorrhea and some nasal congestion. Scant cough with phlegm.      Objective:   Physical Exam Well appearing, no apparent distress. No flat affect. HEENT neck supple. TMs clear. Nasal mucosa clear. No cervical adenopathy. NEURO: Area of diminished gross sensation along the anterolateral aspect of the L thigh. No skin changes or lesions.  No pain or weakness with flexion of L hip, knee.       Assessment & Plan:

## 2013-10-30 ENCOUNTER — Ambulatory Visit (INDEPENDENT_AMBULATORY_CARE_PROVIDER_SITE_OTHER): Payer: 59 | Admitting: Family Medicine

## 2013-10-30 ENCOUNTER — Encounter: Payer: Self-pay | Admitting: Family Medicine

## 2013-10-30 VITALS — BP 115/63 | HR 99 | Temp 98.8°F

## 2013-10-30 DIAGNOSIS — R112 Nausea with vomiting, unspecified: Secondary | ICD-10-CM

## 2013-10-30 MED ORDER — ONDANSETRON HCL 4 MG/5ML PO SOLN: 8.0000 mg | Freq: Once | ORAL | Status: DC

## 2013-10-30 MED ORDER — PROMETHAZINE HCL 25 MG/ML IJ SOLN
25.0000 mg | Freq: Once | INTRAMUSCULAR | Status: AC
  Administered 2013-10-30: 25 mg via INTRAMUSCULAR

## 2013-10-30 MED ORDER — ONDANSETRON 4 MG PO TBDP: 4.0000 mg | ORAL_TABLET | Freq: Three times a day (TID) | ORAL | Status: DC | PRN

## 2013-10-30 NOTE — Assessment & Plan Note (Addendum)
Viral vs food borne. Will treat with Zofran (was given acutely in clinic; patient also given IM phenergan as well). Advised increased fluid intake and close follow up if fails to improve or worsens.

## 2013-10-30 NOTE — Patient Instructions (Signed)
Tome la zofran segn lo prescrito.  Motivos Asegrese de mantenerse bien hidratado. Tome lquidos en abundancia.  Hganos saber si empeoran o no mejoran.   Nuseas y Vmitos (Nausea and Vomiting) La nusea es la sensacin de Tree surgeon en el estmago o de la necesidad de vomitar. El vmito es un reflejo por el que los contenidos del estmago salen por la boca. El vmito puede ocasionar prdida de lquidos del organismo (deshidratacin). Los nios y los [employer] pueden deshidratarse rpidamente (en especial si tambin tienen diarrea). Las nuseas y los vmitos son sntoma de un trastorno o enfermedad. Es importante Energy manager causa de los sntomas. CAUSAS  Irritacin directa de la membrana que cubre el [place]. Esta irritacin puede ser resultado del aumento de la produccin de cido, (reflujo gastroesofgico), infecciones, intoxicacin alimentaria, ciertos medicamentos (como antinflamatorios no esteroideos), consumo de alcohol o de tabaco.  Seales del cerebro.Estas seales pueden ser un dolor de cabeza, exposicin al calor, trastornos del odo interno, aumento de la presin en el cerebro por lesiones, infeccin, un tumor o conmocin cerebral, estmulos emocionales o problemas metablicos.  Una obstruccin en el tracto gastrointestinal (obstruccin intestinal).  Ciertas enfermedades como la diabetes, problemas en la vescula biliar, apendicitis, problemas renales, cncer, sepsis, sntomas atpicos de infarto o trastornos alimentarios.  Tratamientos mdicos como la quimioterapia y la radiacin.  Medicamentos que inducen al sueo (anestesia general) durante Clementeen Hoof. DIAGNSTICO  El mdico podr solicitarle algunos anlisis si los problemas no mejoran luego de algunos das. Tambin podrn pedirle anlisis si los sntomas son graves o si el motivo de los vmitos o las nuseas no est claro. Los [employer] ser:   Anlisis de [place].  Anlisis de [place].  Pruebas de materia  fecal.  Cultivos (para buscar evidencias de infeccin).  Radiografas u otros estudios por imgenes. Los [employer] de las pruebas lo ayudarn al mdico a tomar decisiones acerca del mejor curso de tratamiento o la necesidad de [employer].  TRATAMIENTO  Debe estar bien hidratado. Beba con frecuencia pequeas cantidades de lquido.Puede beber agua, bebidas deportivas, caldos claros o comer pequeos trocitos de hielo o gelatina para mantenerse hidratado.Cuando coma, hgalo lentamente para evitar las nuseas.Hay medicamentos para evitar las nuseas que pueden aliviarlo.  INSTRUCCIONES PARA EL CUIDADO DOMICILIARIO  Si su mdico le prescribe medicamentos tmelos como se le haya indicado.  Si no tiene hambre, no se fuerce a comer. Sin embargo, es necesario que tome lquidos.  Si tiene hambre alimntese con una dieta normal, a menos que el mdico le indique otra cosa.  Los mejores alimentos son Ardelia Mems combinacin de carbohidratos complejos (arroz, trigo, papas, pan), carnes magras, yogur, frutas y Photographer.  Evite los alimentos ricos en grasas porque dificultan la digestin.  Beba gran cantidad de lquido para mantener la orina de tono claro o color amarillo plido.  Si est deshidratado, consulte a su mdico para que le d instrucciones especficas para volver a hidratarlo. Los signos de deshidratacin son:  Doristine Section sed.  Labios y boca secos.  Mareos.  Elmon Else.  Disminucin de la frecuencia y cantidad de la [place].  Confusin.  Tiene el pulso o la respiracin acelerados. SOLICITE ATENCIN MDICA DE INMEDIATO SI:  Vomita sangre o algo similar a la borra del caf.  La materia fecal (heces) es negra o tiene [place].  Sufre una cefalea grave o rigidez en el cuello.  Se siente confundido.  Siente dolor abdominal intenso.  Tiene dolor en el pecho o dificultad para respirar.  No orina por  8 horas.  Tiene la piel fra y pegajosa.  Sigue vomitando durante ms de 24  a 48 horas.  Tiene fiebre. ASEGRESE QUE:   Comprende estas instrucciones.  Controlar su enfermedad.  Solicitar ayuda inmediatamente si no mejora o si empeora. Document Released: 04/05/2007 Document Revised: 06/08/2011 [facility] Patient Information 2015 [place]. This information is not intended to replace advice given to you by your health care provider. Make sure you discuss any questions you have with your health care provider.

## 2013-10-30 NOTE — Progress Notes (Signed)
   Subjective:    Patient ID: Darryl Wang, male    DOB: 06-Nov-1965, 48 y.o.   MRN: [id number]  HPI 48 year old male presents to the clinic today for same day appointment with complaints of nausea and vomiting.  1) Nausea/Vomiting - Patient reports that yesterday evening at approximately 5 PM he developed sudden onset nausea and vomiting. - Emesis -  nonbloody and nonbilious. - Associated symptoms: Epigastric abdominal pain, chills.  He is also had recent headache.  No recent fever. No associated diarrhea. - He does report that he went to a family gathering on Saturday.  At this gathering, he did eat some "chicken salad" which none of his family members ate.  None of his family members have been sick. - No exacerbating or relieving factors.  Patient reports he has been unable to keep anything down and has vomited many times.  Review of Systems Per HPI    Objective:   Physical Exam Filed Vitals:   10/30/13 1159  BP: 115/63  Pulse: 99  Temp: 98.8 F (37.1 C)  Exam: General: patient actively vomiting upon initial encounter.  HEENT: NCAT. Oropharynx clear. TM's obscured by cerumen bilaterally.  Cardiovascular: RRR. No murmurs, rubs, or gallops. Respiratory: CTAB. No rales, rhonchi, or wheeze. Abdomen: soft, nondistended. Mildly tender to palpation in the epigastrium.  No rebound or guarding. Negative Murphy sign.    Assessment & Plan:  See Problem List

## 2013-10-31 MED ORDER — ONDANSETRON HCL 4 MG PO TABS: 4.0000 mg | ORAL_TABLET | Freq: Three times a day (TID) | ORAL | Status: DC | PRN

## 2013-10-31 NOTE — Addendum Note (Signed)
Addended by: Corinna Capra on: 10/31/2013 01:33 PM   Modules accepted: Orders

## 2014-01-31 ENCOUNTER — Other Ambulatory Visit: Payer: Self-pay | Admitting: Family Medicine

## 2014-05-08 ENCOUNTER — Ambulatory Visit: Payer: Self-pay | Admitting: Family Medicine

## 2014-05-08 ENCOUNTER — Encounter: Payer: Self-pay | Admitting: Family Medicine

## 2014-05-08 ENCOUNTER — Ambulatory Visit (INDEPENDENT_AMBULATORY_CARE_PROVIDER_SITE_OTHER): Payer: 59 | Admitting: Family Medicine

## 2014-05-08 VITALS — BP 110/64 | HR 58 | Temp 97.7°F | Ht 65.0 in | Wt 148.0 lb

## 2014-05-08 DIAGNOSIS — F32A Depression, unspecified: Secondary | ICD-10-CM

## 2014-05-08 DIAGNOSIS — F329 Major depressive disorder, single episode, unspecified: Secondary | ICD-10-CM

## 2014-05-08 DIAGNOSIS — Z131 Encounter for screening for diabetes mellitus: Secondary | ICD-10-CM | POA: Diagnosis not present

## 2014-05-08 DIAGNOSIS — F419 Anxiety disorder, unspecified: Secondary | ICD-10-CM

## 2014-05-08 LAB — BASIC METABOLIC PANEL
BUN: 16 mg/dL (ref 6–23)
CO2: 24 mEq/L (ref 19–32)
CREATININE: 0.76 mg/dL (ref 0.50–1.35)
Calcium: 9.6 mg/dL (ref 8.4–10.5)
Chloride: 103 mEq/L (ref 96–112)
Glucose, Bld: 98 mg/dL (ref 70–99)
Potassium: 4.3 mEq/L (ref 3.5–5.3)
Sodium: 135 mEq/L (ref 135–145)

## 2014-05-08 MED ORDER — VENLAFAXINE HCL 75 MG PO TABS: 75.0000 mg | ORAL_TABLET | Freq: Two times a day (BID) | ORAL | Status: DC

## 2014-05-08 NOTE — Patient Instructions (Signed)
Fue un Civil Service fast streamer.  Me alegro que esta' bien.   Estamos chequeandole la glucosa en ayunas hoy, le respondo con los [place].   Bibb y manos entumidas, COCK-UP SPLINTS (soportes para las [place]) en la hora de dormir, quiteselos en la manana cuando se levanta.

## 2014-05-09 ENCOUNTER — Encounter: Payer: Self-pay | Admitting: Family Medicine

## 2014-05-09 NOTE — Assessment & Plan Note (Signed)
Depression symptoms well controlled on Effexor 75mg  twice daily. No thoughts of self-harm.  Has had some decreased libido in the past, not a problem now.  He and wife (present today) have been seeing a counselor Arts development officer) and this is helping. For refills and regular follow up.

## 2014-05-09 NOTE — Assessment & Plan Note (Signed)
Anxiety symptoms not returning; well controlled on Effexor 75mg  twice daily.

## 2014-05-09 NOTE — Progress Notes (Signed)
   Subjective:    Patient ID: Darryl Wang, male    DOB: 11/26/1965, 49 y.o.   MRN: [id number]  HPI Visit in [place]; wife is present for visit.   Patient for follow up of depression, which is managed with effexor 75mg  twice daily and seeing therapist with his wife regularly.  He reports that he does not feel depressed as he used to; wife volunteers that he does appear depressed sometimes on Sundays.  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 Drives himself to work each day, no problems with this. No symptoms of anxiety such as racing thoughts, palpitations or panic. Denies thoughts of self-harm.  Tries to be social when at work and interact with his family.  Both patient and wife report that he is an introverted person and does not often go out of his way to socialize.  No change in his behavior.   Social Hx Does not drink alcohol; never-smoker. 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   Review of Systems See above.     Objective:   Physical Exam Well appearing, no apparent distress HEENT Neck supple, no cervical adenopathy. Moist mucus membranes.  COR Regular S1S2, no extra sounds PULM Clear bilaterally, no rales or wheezes       Assessment & Plan:

## 2014-05-09 NOTE — Assessment & Plan Note (Signed)
For annual DM screening.

## 2014-05-09 NOTE — Progress Notes (Signed)
   Subjective:    Patient ID: Darryl Wang, male    DOB: Jan 22, 1966, 49 y.o.   MRN: [id number]  HPI  In addition to addressing depression, patient reports that he has had some numbness in fingers of both hands.  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  Believes this might be related to the hand numbness. No weakness or dropping things.   Review of Systems     Objective:   Physical Exam On exam, no tinnels or phalens signs.  Bilateral hand strength full and symmetric. Sensation grossly intact, palpable radial pulses bilat. Brisk cap refill.  Plan for cock-up splints at night, remove in the mornings.  If not better, for reassessment.  JB       Assessment & Plan:

## 2014-07-06 ENCOUNTER — Ambulatory Visit (INDEPENDENT_AMBULATORY_CARE_PROVIDER_SITE_OTHER): Payer: 59 | Admitting: Family Medicine

## 2014-07-06 ENCOUNTER — Encounter: Payer: Self-pay | Admitting: Family Medicine

## 2014-07-06 VITALS — Temp 97.6°F | Ht 65.0 in | Wt 144.9 lb

## 2014-07-06 DIAGNOSIS — F329 Major depressive disorder, single episode, unspecified: Secondary | ICD-10-CM | POA: Diagnosis not present

## 2014-07-06 DIAGNOSIS — J301 Allergic rhinitis due to pollen: Secondary | ICD-10-CM

## 2014-07-06 DIAGNOSIS — F32A Depression, unspecified: Secondary | ICD-10-CM

## 2014-07-06 MED ORDER — SPACER/AERO CHAMBER MOUTHPIECE MISC: 1.0000 [IU] | Freq: Four times a day (QID) | Status: DC | PRN

## 2014-07-06 MED ORDER — FLUTICASONE PROPIONATE 50 MCG/ACT NA SUSP: 2.0000 | Freq: Every day | NASAL | Status: DC

## 2014-07-06 MED ORDER — ALBUTEROL SULFATE HFA 108 (90 BASE) MCG/ACT IN AERS: 2.0000 | INHALATION_SPRAY | Freq: Four times a day (QID) | RESPIRATORY_TRACT | Status: DC | PRN

## 2014-07-06 NOTE — Progress Notes (Signed)
   Subjective:    Patient ID: Darryl Wang, male    DOB: 1965-10-21, 49 y.o.   MRN: [id number]  HPI Visit conducted in [place], patient here with his wife.  He reports a cough with some phlegm production for the past 2 weeks,, has had some pressure-like pain across the forehead and nasal bridge. No measurable fevers, although his wife says he felt warm 2 days ago.  Has been taking Nyquil and Halls cough drops which do not help. Also taking Zyrtec 10mg  daily, which likewise has not been very helpful.  Sometimes he has watery eyes and nose; does have scratchy irritated throat which is worse in the mornings.   Reports that his depressive symptoms have been well controlled with the Effexor, dose and schedule reviewed and confirmed.  He is taking the medicine. Wife agrees with his observations.   Social Hx reviewed, no changes. Nonsmoker.  Review of Systems     Objective:   Physical Exam Well appearing, no apparent distress HEENT Neck supple, TMs clear. No cervical adenopathy. Nasal turbinates with watery discharge. Mild cobblestoning without exudates in oropharynx. Moist mucus membranes. Conjunctivae injected.  COR Regular S1S2 PULM Clear bilaterally, no rales or wheezes         Assessment & Plan:

## 2014-07-06 NOTE — Assessment & Plan Note (Signed)
Patient with cough and rhinitis, occasional irritated throat, history of springtime allergies. He is taking Zyrtec 10mg  daily with minimal results.  Plan for addition of Flonase nasal spray 2 sprays per nostril once daily in the mornings, instructions in how to use and rinse mouth after each use. If not better in two weeks, then consider other treatments such as Singulair. He does not have fever (subjectively felt 'warm' day before yesterday), doubt infectious changes as he does not feel ill.  Given Rx for albuterol with spacer, to use if cough persists and not responding to flonase. He does not have an asthma history.

## 2014-07-06 NOTE — Patient Instructions (Signed)
Fue un Civil Service fast streamer.  Creo que la tos se debe a las Set designer, sobretodo a Stage manager provoca la tos.   FLONASE 2 soplidos en cada fosa nasal, cada manana. Gargaras con agua despues de usar la bomba.   Puede usar ALBUREROL con spacer cada 6 horas segun necesite si tiene ataques de tos fuerte.

## 2014-07-06 NOTE — Assessment & Plan Note (Signed)
Has been doing well with his dose of Effexor, to continue this. Blood pressure is not elevated at today's visit.

## 2014-07-08 ENCOUNTER — Other Ambulatory Visit: Payer: Self-pay | Admitting: Family Medicine

## 2014-07-08 DIAGNOSIS — F419 Anxiety disorder, unspecified: Secondary | ICD-10-CM

## 2015-05-28 ENCOUNTER — Ambulatory Visit (INDEPENDENT_AMBULATORY_CARE_PROVIDER_SITE_OTHER): Payer: 59 | Admitting: Family Medicine

## 2015-05-28 ENCOUNTER — Encounter: Payer: Self-pay | Admitting: Family Medicine

## 2015-05-28 VITALS — BP 116/69 | HR 68 | Temp 98.0°F | Wt 147.0 lb

## 2015-05-28 DIAGNOSIS — H6123 Impacted cerumen, bilateral: Secondary | ICD-10-CM | POA: Diagnosis not present

## 2015-05-28 DIAGNOSIS — J069 Acute upper respiratory infection, unspecified: Secondary | ICD-10-CM

## 2015-05-28 DIAGNOSIS — B9789 Other viral agents as the cause of diseases classified elsewhere: Principal | ICD-10-CM

## 2015-05-28 NOTE — Patient Instructions (Signed)
Debrox for ear wax Mucinex, tylenol, ibuprofen Warm salt water gargles Cough drops  Follow up if not better on Friday  Be well, Dr. Ardelia Wang    Infeccin del tracto respiratorio superior, adultos (Upper Respiratory Infection, Adult) La mayora de las infecciones del tracto respiratorio superior son infecciones virales de las vas que llevan el aire a los pulmones. Un infeccin del tracto respiratorio superior afecta la nariz, la garganta y las vas respiratorias superiores. El tipo ms frecuente de infeccin del tracto respiratorio superior es la nasofaringitis, que habitualmente se conoce como "resfro comn". Las infecciones del tracto respiratorio superior siguen su curso y por lo general se curan solas. En la [employer], la infeccin del tracto respiratorio superior no requiere atencin Golf, Armed forces training and education officer a veces, despus de una infeccin viral, puede surgir una infeccin bacteriana en las vas respiratorias superiores. Esto se conoce como infeccin secundaria. Las infecciones sinusales y en el odo medio son tipos frecuentes de infecciones secundarias en el tracto respiratorio superior. La neumona bacteriana tambin puede complicar un cuadro de infeccin del tracto respiratorio superior. Este tipo de infeccin puede empeorar el asma y la enfermedad pulmonar obstructiva crnica (EPOC). En algunos casos, estas complicaciones pueden requerir atencin mdica de emergencia y poner en peligro la vida.  CAUSAS Casi todas las infecciones del tracto respiratorio superior se deben a los virus. Un virus es un tipo de microbio que puede contagiarse de Darryl Wang persona a Darryl Wang.  FACTORES DE RIESGO Puede estar en riesgo de sufrir una infeccin del tracto respiratorio superior si:   Fuma.  Tiene una enfermedad pulmonar o cardaca crnica.  Tiene debilitado el sistema de defensa (inmunitario) del cuerpo.  Es muy joven o de edad muy [name].  Tiene asma o alergias nasales.  Trabaja en reas donde  hay mucha gente o poca ventilacin.  [name] en una escuela o en un centro de atencin mdica. SIGNOS Y SNTOMAS  Habitualmente, los sntomas aparecen de 2a 3das despus de entrar en contacto con el virus del resfro. La mayora de las infecciones virales en el tracto respiratorio superior duran de 7a 10das. Sin embargo, las infecciones virales en el tracto respiratorio superior a causa del virus de la gripe pueden durar de 14a 18das y, habitualmente, son ms graves. Entre los sntomas se pueden incluir los siguientes:   Secrecin o congestin nasal.  Estornudos.  Tos.  Dolor de Investment banker, operational.  Dolor de [place].  Fatiga.  Darryl Wang.  Prdida del apetito.  Dolor en la frente, detrs de los ojos y por encima de los pmulos (dolor sinusal).  Dolores musculares. DIAGNSTICO  El mdico puede diagnosticar una infeccin del tracto respiratorio superior mediante los siguientes estudios:  Examen fsico.  Pruebas para verificar si los sntomas no se deben a otra afeccin, por ejemplo:  Faringitis estreptoccica.  Sinusitis.  Neumona.  Asma. TRATAMIENTO  Esta infeccin desaparece sola, con el tiempo. No puede curarse con medicamentos, pero a menudo se prescriben para aliviar los sntomas. Los medicamentos pueden ser tiles para lo siguiente:  Engineer, materials fiebre.  Reducir la tos.  Aliviar la congestin nasal. INSTRUCCIONES PARA EL CUIDADO EN EL HOGAR   Tome los medicamentos solamente como se lo haya indicado el mdico.  A fin de Best boy de garganta, haga grgaras con solucin salina templada o consuma caramelos para la tos, como se lo haya indicado el mdico.  Use un humidificador de vapor clido o inhale el vapor de la ducha para aumentar la humedad del aire. Esto facilitar la  respiracin.  Beba suficiente lquido para Consulting civil engineer orina clara o de color amarillo plido.  Consuma sopas y otros caldos transparentes, y [employer].  Descanse todo lo que sea  necesario.  Regrese al [place] cuando la temperatura se le haya normalizado o cuando el mdico lo autorice. Es posible que deba quedarse en su casa durante un tiempo prolongado, para no infectar a los dems. [name] usar un barbijo y lavarse las manos con cuidado para Mining engineer propagacin del virus.  Aumente el uso del inhalador si tiene asma.  No consuma ningn producto que contenga tabaco, lo que incluye cigarrillos, tabaco de Higher education careers adviser o Psychologist, sport and exercise. Si necesita ayuda para dejar de fumar, consulte al [name]. PREVENCIN  La mejor manera de protegerse de un resfro es mantener una higiene [name].   Evite el contacto oral o fsico con personas que tengan sntomas de resfro.  En caso de contacto, lvese las manos con frecuencia. No hay pruebas claras de que la vitaminaC, la vitaminaE, la equincea o el ejercicio reduzcan la probabilidad de Museum/gallery curator un resfro. Sin embargo, siempre se recomienda Scientific laboratory technician, hacer ejercicio y Ecologist.  SOLICITE ATENCIN MDICA SI:   Su estado empeora en lugar de mejorar.  Los medicamentos no Animator.  Tiene escalofros.  La sensacin de falta de aire empeora.  Tiene mucosidad marrn o roja.  Tiene secrecin nasal amarilla o marrn.  Le duele la cara, especialmente al inclinarse hacia adelante.  Tiene fiebre.  Tiene los ganglios del cuello hinchados.  Siente dolor al tragar.  Tiene zonas blancas en la parte de atrs de la garganta. SOLICITE ATENCIN MDICA DE INMEDIATO SI:   Tiene sntomas intensos o persistentes de:  Dolor de [place].  Dolor de odos.  Dolor sinusal.  Dolor en el pecho.  Tiene enfermedad pulmonar crnica y cualquiera de estos sntomas:  Sibilancias.  Tos prolongada.  Tos con sangre.  Cambio en la mucosidad habitual.  Presenta rigidez en el cuello.  Tiene cambios en:  La visin.  La audicin.  El pensamiento.  El [place] de nimo. ASEGRESE DE  QUE:   Comprende estas instrucciones.  Controlar su afeccin.  Recibir ayuda de inmediato si no mejora o si empeora.   Esta informacin no tiene Marine scientist el consejo del mdico. Asegrese de hacerle al mdico cualquier pregunta que tenga.   Document Released: 12/24/2004 Document Revised: 07/31/2014 Elsevier Interactive Patient Education [employer].

## 2015-05-29 NOTE — Progress Notes (Signed)
Date of Visit: 05/28/2015   HPI:  Patient presents for a same day appointment to discuss URI symptoms.  For 3 days has had throat pain, mild cough, runny nose, mild headache. Thinks may have had a fever but did not check temperature. Able to drink liquids. Not eating much today. Has not tried any medications. Daughter has been sick with similar symptoms.    ROS: See HPI  [place]: history of allergic conjunctivitis & rhinitis  PHYSICAL EXAM: BP 116/69 mmHg  Pulse 68  Temp(Src) 98 F (36.7 C) (Oral)  Wt 147 lb (66.679 kg) Gen: NAD, pleasant, cooperative HEENT: normocephalic, atraumatic. Tympanic membranes not visualized secondary to cerumen impaction bilaterally. Mild anterior cervical lymphadenopathy. Oropharynx mildly erythematous. Tonsils enlarged but no exudates seen. Heart: regular rate and rhythm, no murmur Lungs: clear to auscultation bilaterally, normal work of breathing  Abdomen: soft, nontender to palpation  Neuro: alert, grossly nonfocal, speech normal  ASSESSMENT/PLAN:  1. Viral URI - symptoms and exam suggestive of viral URI. Discussed supportive measures including mucinex, tylenol, salt water gargles, cough drops. Follow up if not better later this week.  2. Cerumen impaction - recommend over the counter debrox to loosen wax.  FOLLOW UP: Follow up as needed if symptoms worsen or fail to improve.    [place]. Ardelia Mems, [place]

## 2016-01-06 ENCOUNTER — Encounter: Payer: Self-pay | Admitting: Student

## 2016-01-06 ENCOUNTER — Ambulatory Visit (INDEPENDENT_AMBULATORY_CARE_PROVIDER_SITE_OTHER): Payer: 59 | Admitting: Student

## 2016-01-06 DIAGNOSIS — J069 Acute upper respiratory infection, unspecified: Secondary | ICD-10-CM

## 2016-01-06 NOTE — Progress Notes (Signed)
   Subjective:    Patient ID: Darryl Wang, male    DOB: 10-27-1965, 50 y.o.   MRN: [id number]   CC: Cold  HPI: 50 year old male presenting for concern for a cold  Cold - Developed sore throat, cough, mild headache, congestion 3 days ago - Has been drinking tea with honey - Denies any fevers - Denies shortness of breath, nausea, vomiting, diarrhea - Has been able to eat and drink normally - Notes that at work he was exposed to someone who is coughing a lot  Smoking status reviewed  Review of Systems  Per HPI, else denies chest pain, abdominal pain    Objective:  BP (!) 97/59   Pulse 83   Temp 99.8 F (37.7 C) (Oral)   Wt 134 lb (60.8 kg)   SpO2 (!) 84%   BMI 22.30 kg/m  Vitals and nursing note reviewed  General: NAD HEENT: Normal oropharynx, nonerythematous, no lesions noted, no swelling. Mild bilateral cervical lymphadenopathy. Cardiac: RRR Respiratory: CTAB, normal effort Skin: warm and dry, no rashes noted Neuro: alert and oriented   Assessment & Plan:    Acute upper respiratory infection History and physical exam consistent with viral upper respiratory tract infection. - Will treat conservatively at this time - Patient counseled to take tea and honey for sore throat - Counseled to try Alka-Seltzer cold and flu as needed for respiratory symptoms - Patient will follow as needed with PCP if symptoms worsen - Work note given for today as requested.    Ianna Salmela A. Lincoln Brigham MD, [facility] Resident PGY-3 Pager [phone]

## 2016-01-06 NOTE — Patient Instructions (Signed)
You were seen for upper respiratory tract infection likely secondary to a virus. Please drink tea with honey to help with cough and sore throat. You may use Alka-Seltzer cold and flu to help with symptoms as well If you feel your symptoms are worsening or he develop fevers please call the office reevaluation If questions or concerns call the office at [phone]

## 2016-01-06 NOTE — Assessment & Plan Note (Signed)
History and physical exam consistent with viral upper respiratory tract infection. - Will treat conservatively at this time - Patient counseled to take tea and honey for sore throat - Counseled to try Alka-Seltzer cold and flu as needed for respiratory symptoms - Patient will follow as needed with PCP if symptoms worsen - Work note given for today as requested.

## 2019-10-09 ENCOUNTER — Ambulatory Visit: Payer: 59 | Attending: Internal Medicine

## 2019-10-09 DIAGNOSIS — Z23 Encounter for immunization: Secondary | ICD-10-CM

## 2019-10-09 NOTE — Progress Notes (Signed)
   Covid-19 Vaccination Clinic  Name:  Darryl Wang    MRN: [id number] DOB: [birth year]  10/09/2019  Darryl Wang was observed post Covid-19 immunization for 15 minutes without incident. He was provided with Vaccine Information Sheet and instruction to access the V-Safe system.   Darryl Wang was instructed to call 911 with any severe reactions post vaccine: Marland Kitchen Difficulty breathing  . Swelling of face and throat  . A fast heartbeat  . A bad rash all over body  . Dizziness and weakness   Immunizations Administered    Name Date Dose VIS Date Route   Pfizer COVID-19 Vaccine 10/09/2019  5:11 PM 0.3 mL 05/24/2018 Intramuscular   Manufacturer: [employer]   Lot: [id number]   [place]: [id number]

## 2019-10-31 ENCOUNTER — Ambulatory Visit: Payer: Self-pay | Attending: Internal Medicine

## 2019-10-31 DIAGNOSIS — Z23 Encounter for immunization: Secondary | ICD-10-CM

## 2019-10-31 NOTE — Progress Notes (Signed)
   Covid-19 Vaccination Clinic  Name:  Rocio Wolak    MRN: [id number] DOB: September 12, 1965  10/31/2019  Mr. Merced was observed post Covid-19 immunization for 15 minutes without incident. He was provided with Vaccine Information Sheet and instruction to access the V-Safe system.   Mr. Deshmukh was instructed to call 911 with any severe reactions post vaccine: Marland Kitchen Difficulty breathing  . Swelling of face and throat  . A fast heartbeat  . A bad rash all over body  . Dizziness and weakness   Immunizations Administered    Name Date Dose VIS Date Route   Pfizer COVID-19 Vaccine 10/31/2019  1:23 PM 0.3 mL 05/24/2018 Intramuscular   Manufacturer: [place]   Lot: [id number]   [place]: [id number]

## 2020-07-28 ENCOUNTER — Ambulatory Visit (HOSPITAL_COMMUNITY)
Admission: EM | Admit: 2020-07-28 | Discharge: 2020-07-28 | Disposition: A | Discharge status: Home / Self Care | Payer: BC Managed Care – PPO | Attending: Family | Admitting: Family

## 2020-07-28 ENCOUNTER — Other Ambulatory Visit: Payer: Self-pay

## 2020-07-28 DIAGNOSIS — F332 Major depressive disorder, recurrent severe without psychotic features: Secondary | ICD-10-CM

## 2020-07-28 DIAGNOSIS — F411 Generalized anxiety disorder: Secondary | ICD-10-CM

## 2020-07-28 MED ORDER — BUSPIRONE HCL 5 MG PO TABS: 5.0000 mg | ORAL_TABLET | Freq: Two times a day (BID) | ORAL | 0 refills | Status: DC

## 2020-07-28 MED ORDER — TRAZODONE HCL 50 MG PO TABS: 50.0000 mg | ORAL_TABLET | Freq: Every evening | ORAL | Status: DC | PRN

## 2020-07-28 MED ORDER — HYDROXYZINE HCL 25 MG PO TABS: 25.0000 mg | ORAL_TABLET | Freq: Four times a day (QID) | ORAL | Status: DC | PRN

## 2020-07-28 MED ORDER — TRAZODONE HCL 50 MG PO TABS: 50.0000 mg | ORAL_TABLET | Freq: Every evening | ORAL | 0 refills | Status: DC | PRN

## 2020-07-28 MED ORDER — HYDROXYZINE HCL 25 MG PO TABS: 25.0000 mg | ORAL_TABLET | Freq: Four times a day (QID) | ORAL | 0 refills | Status: DC | PRN

## 2020-07-28 NOTE — Discharge Instructions (Addendum)
Take all medications as prescribed. Keep all follow-up appointments as scheduled.  Do not consume alcohol or use illegal drugs while on prescription medications. Report any adverse effects from your medications to your primary care provider promptly.  In the event of recurrent symptoms or worsening symptoms, call 911, a crisis hotline, or go to the nearest emergency department for evaluation.   

## 2020-07-28 NOTE — Progress Notes (Signed)
Patient has been struggling with anxiety and depression for the past 10 years.  Most recently, patient's anxiety has increased to the point that he is experiencing anxiety attecks.  He experienced one while he was in [place] with his daughter today.  Patient states that he has been having fleeting suicidal thoughts of jumping out of a moving car, but states that he has no prior suicide attempts and states that he would not act on these thoughts.  Patient has multiple stressors with work, his marriage and his son recently had surgery.  Patient is only sleeping 5-6 hours per night and states that his appetite has decreased and he has lost 10 pounds.  Patient just recently started seeing a therapist and was prescribed Prozac, but the medication made him feel worse so the stopped taking it.  Patient, however, is taking hydroxyzine for his anxiety, but it is not helping.  Patient does not feel like he needs to be in the hospital, but feels like he needs to be on different medication.

## 2020-07-28 NOTE — ED Provider Notes (Signed)
Behavioral Health Urgent Care Medical Screening Exam  Patient Name: Darryl Wang MRN: [id number] Date of Evaluation: 07/28/20 Chief Complaint:   Diagnosis:  Final diagnoses:  None    History of Present illness: Darryl Wang is a 55 y.o. male Spanish-speaking only presents accompanied by his daughter citing worsening depression and anxiety.  Reports passive suicidal ideations.  Denies plan or intent.  Reports ongoing ruminations about " little things and everything" reports his worsening anxiety his calls stress and tension between him and his wife.  Reports he has been prescribed BuSpar and paroxetine for mood stabilization however he does not feel as if the medication was helping.  States he discontinued taking.  Daughter reports patient has a follow-up appointment with nurse practitioner on tomorrow reports will follow-up with medication management.  Reports he is followed by the [facility] who recently prescribed medications.  He denied previous inpatient admissions.  Denied previous suicide attempts.  Patient to follow-up with prescribed providers within [facility] network and will make additional outpatient resources available.  Patient to start hydroxyzine and trazodone for mood stabilization.  Patient and family was receptive to plan.  Psychiatric Specialty Exam  Presentation  General Appearance:Appropriate for Environment  Eye Contact:Good  Speech:Clear and Coherent  Speech Volume:Normal  Handedness:No data recorded  Mood and Affect  Mood:Anxious; Depressed; Irritable  Affect:Congruent   Thought Process  Thought Processes:Goal Directed  Descriptions of Associations:Intact  Orientation:Full (Time, Place and Person)  Thought Content:Logical    Hallucinations:None  Ideas of Reference:None  Suicidal Thoughts:No  Homicidal Thoughts:No   Sensorium  Memory:Immediate Fair; Recent Fair; Remote Fair  Judgment:Intact  Insight:Fair   Executive  Functions  Concentration:Fair  Attention Span:Fair  [place]   Psychomotor Activity  Psychomotor Activity:Normal   Assets  Assets:Desire for Improvement; Social Support   Sleep  Sleep:Fair  Number of hours: No data recorded  Nutritional Assessment (For OBS and FBC admissions only) Has the patient had a weight loss or gain of 10 pounds or more in the last 3 months?: No Has the patient had a decrease in food intake/or appetite?: No Does the patient have dental problems?: No Does the patient have eating habits or behaviors that may be indicators of an eating disorder including binging or inducing vomiting?: No Has the patient recently lost weight without trying?: No Has the patient been eating poorly because of a decreased appetite?: No Malnutrition Screening Tool Score: 0    Physical Exam: Physical Exam Vitals reviewed.  Cardiovascular:     Rate and Rhythm: Normal rate and regular rhythm.  Neurological:     Mental Status: He is alert.  Psychiatric:        Attention and Perception: Attention normal.        Mood and Affect: Mood normal.        Speech: Speech normal.        Behavior: Behavior normal. Behavior is cooperative.        Thought Content: Thought content is paranoid. Thought content does not include suicidal (passive ideations) ideation.        Cognition and Memory: Cognition normal.        Judgment: Judgment normal.    Review of Systems  Psychiatric/Behavioral: Positive for depression. Suicidal ideas: passive ideiaons  The patient is nervous/anxious and has insomnia.   All other systems reviewed and are negative.  Blood pressure 137/78, pulse 93, temperature 98 F (36.7 C), temperature source Temporal, resp. rate 18, SpO2  99 %. There is no height or weight on file to calculate BMI.  Musculoskeletal: Strength & Muscle Tone: within normal limits Gait & Station: normal Patient leans: N/A   [place] MSE  Discharge Disposition for Follow up and Recommendations: Based on my evaluation the patient does not appear to have an emergency medical condition and can be discharged with resources and follow up care in outpatient services for Medication Management, Partial Hospitalization Program and Individual Therapy   Will make hydroxyzine 25 mg available for anxiety We will make trazodone 50 mg available nightly as needed Keep follow-up appointment with nurse practitioner on 07/29/2020   [name], NP 07/28/2020, 12:39 PM

## 2020-08-08 ENCOUNTER — Other Ambulatory Visit: Payer: Self-pay

## 2020-08-08 ENCOUNTER — Other Ambulatory Visit (HOSPITAL_COMMUNITY): Payer: Self-pay | Admitting: Family

## 2020-08-08 ENCOUNTER — Ambulatory Visit (HOSPITAL_COMMUNITY)
Admission: EM | Admit: 2020-08-08 | Discharge: 2020-08-09 | Disposition: A | Discharge status: Home / Self Care | Payer: BC Managed Care – PPO | Attending: Psychiatry | Admitting: Psychiatry

## 2020-08-08 DIAGNOSIS — F333 Major depressive disorder, recurrent, severe with psychotic symptoms: Secondary | ICD-10-CM | POA: Diagnosis not present

## 2020-08-08 DIAGNOSIS — F419 Anxiety disorder, unspecified: Secondary | ICD-10-CM | POA: Diagnosis not present

## 2020-08-08 DIAGNOSIS — Z20822 Contact with and (suspected) exposure to covid-19: Secondary | ICD-10-CM | POA: Diagnosis not present

## 2020-08-08 DIAGNOSIS — F32A Depression, unspecified: Secondary | ICD-10-CM | POA: Diagnosis present

## 2020-08-08 DIAGNOSIS — Z630 Problems in relationship with spouse or partner: Secondary | ICD-10-CM | POA: Insufficient documentation

## 2020-08-08 LAB — LIPID PANEL
Cholesterol: 245 mg/dL — ABNORMAL HIGH (ref 0–200)
HDL: 75 mg/dL (ref >40)
LDL Cholesterol: 129 mg/dL — ABNORMAL HIGH (ref 0–99)
Total CHOL/HDL Ratio: 3.3 RATIO
Triglycerides: 204 mg/dL — ABNORMAL HIGH (ref <150)
VLDL: 41 mg/dL — ABNORMAL HIGH (ref 0–40)

## 2020-08-08 LAB — CBC WITH DIFFERENTIAL/PLATELET
Abs Immature Granulocytes: 0.03 10*3/uL (ref 0.00–0.07)
Basophils Absolute: 0 10*3/uL (ref 0.0–0.1)
Basophils Relative: 0 %
Eosinophils Absolute: 0 10*3/uL (ref 0.0–0.5)
Eosinophils Relative: 0 %
HCT: 43.8 % (ref 39.0–52.0)
Hemoglobin: 15.3 g/dL (ref 13.0–17.0)
Immature Granulocytes: 0 %
Lymphocytes Relative: 38 %
Lymphs Abs: 4.2 10*3/uL — ABNORMAL HIGH (ref 0.7–4.0)
MCH: 29.2 pg (ref 26.0–34.0)
MCHC: 34.9 g/dL (ref 30.0–36.0)
MCV: 83.6 fL (ref 80.0–100.0)
Monocytes Absolute: 0.9 10*3/uL (ref 0.1–1.0)
Monocytes Relative: 8 %
Neutro Abs: 5.9 10*3/uL (ref 1.7–7.7)
Neutrophils Relative %: 54 %
Platelets: 309 10*3/uL (ref 150–400)
RBC: 5.24 MIL/uL (ref 4.22–5.81)
RDW: 12.7 % (ref 11.5–15.5)
WBC: 11.2 10*3/uL — ABNORMAL HIGH (ref 4.0–10.5)
nRBC: 0 % (ref 0.0–0.2)

## 2020-08-08 LAB — COMPREHENSIVE METABOLIC PANEL
ALT: 24 U/L (ref 0–44)
AST: 23 U/L (ref 15–41)
Albumin: 4.5 g/dL (ref 3.5–5.0)
Alkaline Phosphatase: 61 U/L (ref 38–126)
Anion gap: 10 (ref 5–15)
BUN: 10 mg/dL (ref 6–20)
CO2: 24 mmol/L (ref 22–32)
Calcium: 9.7 mg/dL (ref 8.9–10.3)
Chloride: 100 mmol/L (ref 98–111)
Creatinine, Ser: 0.65 mg/dL (ref 0.61–1.24)
GFR, Estimated: >60 mL/min (ref >60)
Glucose, Bld: 86 mg/dL (ref 70–99)
Potassium: 4.1 mmol/L (ref 3.5–5.1)
Sodium: 134 mmol/L — ABNORMAL LOW (ref 135–145)
Total Bilirubin: 0.8 mg/dL (ref 0.3–1.2)
Total Protein: 7.2 g/dL (ref 6.5–8.1)

## 2020-08-08 LAB — TSH: TSH: 2.156 u[IU]/mL (ref 0.350–4.500)

## 2020-08-08 LAB — HEMOGLOBIN A1C
Hgb A1c MFr Bld: 6.2 % — ABNORMAL HIGH (ref 4.8–5.6)
Mean Plasma Glucose: 131.24 mg/dL

## 2020-08-08 LAB — POCT URINE DRUG SCREEN - MANUAL ENTRY (I-SCREEN)
POC Amphetamine UR: NOT DETECTED
POC Buprenorphine (BUP): NOT DETECTED
POC Cocaine UR: NOT DETECTED
POC Marijuana UR: NOT DETECTED
POC Methadone UR: NOT DETECTED
POC Methamphetamine UR: NOT DETECTED
POC Morphine: NOT DETECTED
POC Oxazepam (BZO): NOT DETECTED
POC Oxycodone UR: NOT DETECTED
POC Secobarbital (BAR): NOT DETECTED

## 2020-08-08 LAB — POC SARS CORONAVIRUS 2 AG: SARSCOV2ONAVIRUS 2 AG: NEGATIVE

## 2020-08-08 LAB — MAGNESIUM: Magnesium: 2.4 mg/dL (ref 1.7–2.4)

## 2020-08-08 LAB — ETHANOL: Alcohol, Ethyl (B): <10 mg/dL (ref <10)

## 2020-08-08 LAB — POC SARS CORONAVIRUS 2 AG -  ED: SARS Coronavirus 2 Ag: NEGATIVE

## 2020-08-08 MED ORDER — ACETAMINOPHEN 325 MG PO TABS: 650.0000 mg | ORAL_TABLET | Freq: Four times a day (QID) | ORAL | Status: DC | PRN

## 2020-08-08 MED ORDER — MAGNESIUM HYDROXIDE 400 MG/5ML PO SUSP: 30.0000 mL | Freq: Every day | ORAL | Status: DC | PRN

## 2020-08-08 MED ORDER — ALUM & MAG HYDROXIDE-SIMETH 200-200-20 MG/5ML PO SUSP: 30.0000 mL | ORAL | Status: DC | PRN

## 2020-08-08 MED ORDER — HYDROXYZINE HCL 25 MG PO TABS
25.0000 mg | ORAL_TABLET | Freq: Three times a day (TID) | ORAL | Status: DC | PRN
  Administered 2020-08-08: 25 mg via ORAL
  Filled 2020-08-08: qty 1

## 2020-08-08 MED ORDER — TRAZODONE HCL 50 MG PO TABS
50.0000 mg | ORAL_TABLET | Freq: Every evening | ORAL | Status: DC | PRN
  Administered 2020-08-08: 50 mg via ORAL
  Filled 2020-08-08: qty 1

## 2020-08-08 NOTE — ED Provider Notes (Signed)
Behavioral Health Admission H&P [facility] & OBS)  Date: 08/08/20 Patient Name: Darryl Wang MRN: [id number] Chief Complaint: No chief complaint on file.     Diagnoses:  Final diagnoses:  Severe episode of recurrent major depressive disorder, with psychotic features [facility])    HPI: Patient presents voluntarily to [facility] behavioral health for walk-in assessment. Icholas is assessed by nurse practitioner.  Language interpreter used during assessment, Verdis Frederickson interpreter [phone], [place].  Patient request that wife, Danae Chen and daughter,Nubia remain present during assessment.  He is alert and oriented, answers appropriately.  He is pleasant and cooperative during assessment. He reports depressed mood and increased anxiety x10 days.  He reports feeling mildly anxious x2 months.  He reports current stressors include "many problems with my wife, my wife talks about separation."  Patient reports decreased sleep and decreased appetite x2 months.  He reports feeling restless and anxious at home, reports pacing at times.   He denies suicidal and homicidal ideations.  He denies any history of self-harm, denies any history of suicide attempts.  He denies auditory visual hallucinations.  He endorses mild paranoia, reports feeling for some time that he may "have a disease and die," mentions "cancer."  Mannie has been diagnosed with depression approximately 15 years ago.  He has not been followed by outpatient psychiatry for many years.  He reports symptoms have remained throughout his life.  He states "I have always felt a little bit like that (depression.)"  He has recently been seen by outpatient primary care provider who initially prescribed Prozac however patient felt that he was unable to use Prozac related to side effects.  He does not discuss side effects in detail with this Probation officer.  Current medications include Wellbutrin, hydroxyzine and trazodone.  He reports compliance with his medications x1 week.  He does  have an outpatient psychiatry virtual appointment on this Saturday, 08/10/2020.  He resides in [place] with his wife and daughter.  He denies access to weapons.  He is employed in the [employer] but has not worked x1 week related to his anxiety.  He denies alcohol and drug use.  Patient offered support and encouragement.  Patient's wife, Danae Chen, reports patient has increased anxiety x2 weeks.  She has noticed patient pacing and repeating "I am going to be okay."  She also reports patient has required reminding to shower and change close over the past 2 weeks.  No reported concern for safety.   PHQ 2-9:     Total Time spent with patient: 45 minutes  Musculoskeletal  Strength & Muscle Tone: within normal limits Gait & Station: normal Patient leans: N/A  Psychiatric Specialty Exam  Presentation General Appearance: Appropriate for Environment; Casual  Eye Contact:Good  Speech:Clear and Coherent; Normal Rate  Speech Volume:Normal  Handedness:Right   Mood and Affect  Mood:Anxious  Affect:Congruent   Thought Process  Thought Processes:Coherent; Goal Directed  Descriptions of Associations:Intact  Orientation:Full (Time, Place and Person)  Thought Content:Logical    Hallucinations:Hallucinations: None  Ideas of Reference:Paranoia  Suicidal Thoughts:Suicidal Thoughts: No  Homicidal Thoughts:Homicidal Thoughts: No   Sensorium  Memory:Immediate Good; Recent Good; Remote Good  Judgment:Fair  Insight:Good   Executive Functions  Concentration:Good  Attention Span:Good  [place] of Knowledge:Good  Language:Good   Psychomotor Activity  Psychomotor Activity:Psychomotor Activity: [place]   Assets  Assets:Communication Skills; Desire for Improvement; Financial Resources/Insurance; Housing; Intimacy; Leisure Time; Physical Health; Resilience; Social Support; Talents/Skills   Sleep  Sleep:Sleep: Poor   Nutritional Assessment (For OBS  and FBC admissions only) Has the patient had a weight loss or gain of 10 pounds or more in the last 3 months?: No Has the patient had a decrease in food intake/or appetite?: No Does the patient have dental problems?: No Does the patient have eating habits or behaviors that may be indicators of an eating disorder including binging or inducing vomiting?: No Has the patient recently lost weight without trying?: No Has the patient been eating poorly because of a decreased appetite?: No Malnutrition Screening Tool Score: 0    Physical Exam ROS  Blood pressure (!) 151/95, pulse 87, temperature 99 F (37.2 C), temperature source Oral, resp. rate 16, SpO2 99 %. There is no height or weight on file to calculate BMI.  Past Psychiatric History: Major depressive disorder  Is the patient at risk to self? No  Has the patient been a risk to self in the past 6 months? No .    Has the patient been a risk to self within the distant past? No   Is the patient a risk to others? No   Has the patient been a risk to others in the past 6 months? No   Has the patient been a risk to others within the distant past? No   Past Medical History:  Past Medical History:  Diagnosis Date  . Depression    previously treated with zoloft (not helpful) and prozac (made him anxious). Was under care of male therapist, about 7 yrs ago.   No past surgical history on file.  Family History:  Family History  Problem Relation Age of Onset  . Arthritis Mother   . Depression Mother   . Alcohol abuse Father   . Diabetes Father   . Kidney disease Brother   . Depression Maternal Grandmother     Social History:  Social History   Socioeconomic History  . Marital status: Married    Spouse name: Not on file  . Number of children: Not on file  . Years of education: Not on file  . Highest education level: Not on file  Occupational History  . Not on file  Tobacco Use  . Smoking status: Never Smoker  . Smokeless  tobacco: Never Used  Substance and Sexual Activity  . Alcohol use: No  . Drug use: No  . Sexual activity: Not on file  Other Topics Concern  . Not on file  Social History Narrative  . Not on file   Social Determinants of Health   Financial Resource Strain: Not on file  Food Insecurity: Not on file  Transportation Needs: Not on file  Physical Activity: Not on file  Stress: Not on file  Social Connections: Not on file  Intimate Partner Violence: Not on file    SDOH:  SDOH Screenings   Alcohol Screen: Not on file  Depression (YJE5-6): Not on file  Financial Resource Strain: Not on file  Food Insecurity: Not on file  Housing: Not on file  Physical Activity: Not on file  Social Connections: Not on file  Stress: Not on file  Tobacco Use: Low Risk   . Smoking Tobacco Use: Never Smoker  . Smokeless Tobacco Use: Never Used  Transportation Needs: Not on file    Last Labs:  No visits with results within 6 Month(s) from this visit.  Latest known visit with results is:  Office Visit on 05/08/2014  Component Date Value Ref Range Status  . Sodium 05/08/2014 135  135 - 145 mEq/L Final  .  Potassium 05/08/2014 4.3  3.5 - 5.3 mEq/L Final  . Chloride 05/08/2014 103  96 - 112 mEq/L Final  . CO2 05/08/2014 24  19 - 32 mEq/L Final  . Glucose, Bld 05/08/2014 98  70 - 99 mg/dL Final  . BUN 05/08/2014 16  6 - 23 mg/dL Final  . Creat 05/08/2014 0.76  0.50 - 1.35 mg/dL Final  . Calcium 05/08/2014 9.6  8.4 - 10.5 mg/dL Final    Allergies: Penicillins  PTA Medications: (Not in a hospital admission)   Medical Decision Making  Discussed restarting home medications, patient in agreement with plan.  Discussed possibility of inpatient psychiatric admission, patient in agreement with plan.  Medications: - Bupropion XL 150 mg daily -Hydroxyzine 25 mg 3 times daily as needed/anxiety -Trazodone 50 mg nightly as needed/sleep    Recommendations  Based on my evaluation the patient does not  appear to have an emergency medical condition.  Patient reviewed with Dr. Serafina Mitchell.  Patient will be placed in the continuous assessment area at [facility] for treatment and stabilization.  He will be reevaluated on 08/09/2020, disposition will be determined at that time.  Lucky Rathke, FNP 08/08/20  7:44 PM

## 2020-08-08 NOTE — BH Assessment (Signed)
Patient reports with wife and daughter reporting AH that his body is telling him he will get cancer or another illness. Patient reports being afraid of illnesses . Patient has history of anxiety and depression and is requesting to be seen. Patient has scheduled psychiatry appointment on 5/13 and outpatient therapy appointment on 5/20. Patient medication is being managed by PCP . Patient is routine

## 2020-08-08 NOTE — ED Notes (Signed)
Pt has been brought on the unit. He has eaten a chicken salad sandwich and drank apple juice

## 2020-08-08 NOTE — ED Notes (Addendum)
Pt up pacing unit, back and forth from bed to bathroom. [place] interpreter used. Pt states he is "feeling anxious." Pt also requesting apple juice. PRN medication and apple juice given. Pt denies any further needs at this time. No signs of acute distress noted. Will continue to monitor for safety.
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
## 2020-08-09 LAB — RESP PANEL BY RT-PCR (FLU A&B, COVID) ARPGX2
Influenza A by PCR: NEGATIVE
Influenza B by PCR: NEGATIVE
SARS Coronavirus 2 by RT PCR: NEGATIVE

## 2020-08-09 NOTE — ED Notes (Signed)
Pt currently in bed sleep, breathing unlabored, environment check complete, will continue to monitor

## 2020-08-09 NOTE — ED Notes (Signed)
Pt pacing in the assessment area. Safety maintained and will continue to monitor.

## 2020-08-09 NOTE — ED Provider Notes (Signed)
FBC/OBS ASAP Discharge Summary  Date and Time: 08/09/2020 1:35 PM  Name: Darryl Wang  MRN:  [id number]   Discharge Diagnoses:  Final diagnoses:  Severe episode of recurrent major depressive disorder, with psychotic features [facility])    Evaluation: Tiffany Talarico was seen and evaluated face to face. Translation was provided by strause #[id number].  Currently patient is denying suicidal or homicidal ideations.  Denies auditory or visual hallucinations.  Does state " my memory is not great."  Patient was recently started on Wellbutrin, hydroxyzine and trazodone.  Which he reports was prescribed by his primary care provider.  Discussed keeping outpatient follow-up appointments.  Will recommend discharge. Wife reported concerns with paranoia, and forgetfulness. Patient denied, however reported " I was told that I would see a psychiatrist." Will make resources avabl for outpatient follow-up. Support, encouragement and reassurances was provided.    Per admission assessment note: Darryl Wang is a 55 year old make who presents voluntary and accompanied by his family. [employer] Spanish Interpreter was used during the assessment [name] [phone]). Pt consented for his wife [name], [phone]) to be presents during the assessment. Clinician asked the pt, "what brought you to the hospital?" Pt asked his wife to answer for him. Per wife, the pt is very anxious, it started slowly about five months ago but increased two months ago; her and the pt were having relationship problems and talking about separation. Per wife, the pt is not in reality he has so many fears, he doesn't remember taking showers, putting on deodorant, has to be reminded to eat, is not sleeping, pacing/walking outside their apartment. Per wife, the pt forget what he does, afraid, of people, being around people because their faces stays in his mind. Clinician asked the pt if he has suicidal thought. Pt replied, [place] he has  thoughts a week ago. Pt reported, two weeks ago, he had thoughts of hurting a Mudlogger. Per wife, pt's body tells him he has certain body illness, pt is not able to function. Pt reports he feels like his stomach talks to him. Pt denies, current, SI, HI, self-injurious behaviors and access to weapons.    Total Time spent with patient: 15 minutes  Past Psychiatric History:  Past Medical History:  Past Medical History:  Diagnosis Date  . Depression    previously treated with zoloft (not helpful) and prozac (made him anxious). Was under care of male therapist, about 7 yrs ago.   No past surgical history on file. Family History:  Family History  Problem Relation Age of Onset  . Arthritis Mother   . Depression Mother   . Alcohol abuse Father   . Diabetes Father   . Kidney disease Brother   . Depression Maternal Grandmother    Family Psychiatric History:  Social History:  Social History   Substance and Sexual Activity  Alcohol Use No     Social History   Substance and Sexual Activity  Drug Use No    Social History   Socioeconomic History  . Marital status: Married    Spouse name: Not on file  . Number of children: Not on file  . Years of education: Not on file  . Highest education level: Not on file  Occupational History  . Not on file  Tobacco Use  . Smoking status: Never Smoker  . Smokeless tobacco: Never Used  Substance and Sexual Activity  . Alcohol use: No  . Drug use: No  .  Sexual activity: Not on file  Other Topics Concern  . Not on file  Social History Narrative  . Not on file   Social Determinants of Health   Financial Resource Strain: Not on file  Food Insecurity: Not on file  Transportation Needs: Not on file  Physical Activity: Not on file  Stress: Not on file  Social Connections: Not on file   SDOH:  SDOH Screenings   Alcohol Screen: Not on file  Depression (OHY0-7): Not on file  Financial Resource Strain: Not on file  Food Insecurity:  Not on file  Housing: Not on file  Physical Activity: Not on file  Social Connections: Not on file  Stress: Not on file  Tobacco Use: Low Risk   . Smoking Tobacco Use: Never Smoker  . Smokeless Tobacco Use: Never Used  Transportation Needs: Not on file    Has this patient used any form of tobacco in the last 30 days? (Cigarettes, Smokeless Tobacco, Cigars, and/or Pipes) Prescription not provided because: non tabacco user  Current Medications:  Current Facility-Administered Medications  Medication Dose Route Frequency Provider Last Rate Last Admin  . acetaminophen (TYLENOL) tablet 650 mg  650 mg Oral Q6H PRN Lucky Rathke, FNP      . alum & mag hydroxide-simeth (MAALOX/MYLANTA) 200-200-20 MG/5ML suspension 30 mL  30 mL Oral Q4H PRN Lucky Rathke, FNP      . hydrOXYzine (ATARAX/VISTARIL) tablet 25 mg  25 mg Oral TID PRN Lucky Rathke, FNP   25 mg at 08/08/20 2144  . magnesium hydroxide (MILK OF MAGNESIA) suspension 30 mL  30 mL Oral Daily PRN Lucky Rathke, FNP      . traZODone (DESYREL) tablet 50 mg  50 mg Oral QHS PRN Lucky Rathke, FNP   50 mg at 08/08/20 2145   Current Outpatient Medications  Medication Sig Dispense Refill  . buPROPion (WELLBUTRIN XL) 150 MG 24 hr tablet Take 150 mg by mouth every morning.    . hydrOXYzine (ATARAX/VISTARIL) 25 MG tablet Take 1 tablet (25 mg total) by mouth every 6 (six) hours as needed for anxiety or vomiting. (Patient taking differently: Take 25 mg by mouth 3 (three) times daily as needed for anxiety.) 30 tablet 0  . traZODone (DESYREL) 50 MG tablet Take 1 tablet (50 mg total) by mouth at bedtime and may repeat dose one time if needed. 15 tablet 0    PTA Medications: (Not in a hospital admission)   Musculoskeletal  Strength & Muscle Tone: within normal limits Gait & Station: normal Patient leans: N/A  Psychiatric Specialty Exam  Presentation  General Appearance: Appropriate for Environment; Casual  Eye Contact:Good  Speech:Clear and  Coherent; Normal Rate  Speech Volume:Normal  Handedness:Right   Mood and Affect  Mood:Anxious  Affect:Congruent   Thought Process  Thought Processes:Coherent; Goal Directed  Descriptions of Associations:Intact  Orientation:Full (Time, Place and Person)  Thought Content:Logical     Hallucinations:Hallucinations: None  Ideas of Reference:Paranoia  Suicidal Thoughts:Suicidal Thoughts: No  Homicidal Thoughts:Homicidal Thoughts: No   Sensorium  Memory:Immediate Good; Recent Good; Remote Good  Judgment:Fair  Insight:Good   Executive Functions  Concentration:Good  Attention Span:Good  [place] of Knowledge:Good  Language:Good   Psychomotor Activity  Psychomotor Activity:Psychomotor Activity: Mannerisms   Assets  Assets:Communication Skills; Desire for Improvement; Financial Resources/Insurance; Housing; Intimacy; Leisure Time; Physical Health; Resilience; Social Support; Talents/Skills   Sleep  Sleep:Sleep: Poor   Nutritional Assessment (For OBS and FBC admissions only) Has the patient  had a weight loss or gain of 10 pounds or more in the last 3 months?: No Has the patient had a decrease in food intake/or appetite?: No Does the patient have dental problems?: No Does the patient have eating habits or behaviors that may be indicators of an eating disorder including binging or inducing vomiting?: No Has the patient recently lost weight without trying?: No Has the patient been eating poorly because of a decreased appetite?: No Malnutrition Screening Tool Score: 0    Physical Exam  Physical Exam Vitals and nursing note reviewed.  Cardiovascular:     Rate and Rhythm: Normal rate and regular rhythm.  Neurological:     Mental Status: He is alert.  Psychiatric:        Attention and Perception: Attention normal. He does not perceive auditory hallucinations.        Mood and Affect: Mood normal.        Speech: Speech normal.        Behavior:  Behavior normal. Behavior is cooperative.        Thought Content: Thought content is paranoid. Thought content does not include suicidal ideation. Thought content does not include homicidal or suicidal plan.        Cognition and Memory: Cognition normal.    Review of Systems  Psychiatric/Behavioral: Positive for depression. Negative for substance abuse and suicidal ideas. The patient has insomnia. The patient is not nervous/anxious.   All other systems reviewed and are negative.  Blood pressure 134/80, pulse 81, temperature 98.4 F (36.9 C), temperature source Tympanic, resp. rate 20, SpO2 100 %. There is no height or weight on file to calculate BMI.  Demographic Factors:  Male  Loss Factors: Loss of significant relationship and Financial problems/change in socioeconomic status  Historical Factors: NA  Risk Reduction Factors:   Living with another person, especially a relative, Positive social support and Positive therapeutic relationship  Continued Clinical Symptoms:  Depression:   Anhedonia Insomnia  Cognitive Features That Contribute To Risk:  Closed-mindedness    Suicide Risk:  Minimal: No identifiable suicidal ideation.  Patients presenting with no risk factors but with morbid ruminations; may be classified as minimal risk based on the severity of the depressive symptoms  Plan Of Care/Follow-up recommendations:  Activity:  as tolerated Diet:  heart healthly   Disposition: Take all medications as prescribed. Keep all follow-up appointments as scheduled.  Do not consume alcohol or use illegal drugs while on prescription medications. Report any adverse effects from your medications to your primary care provider promptly.  In the event of recurrent symptoms or worsening symptoms, call 911, a crisis hotline, or go to the nearest emergency department for evaluation.   [name], NP 08/09/2020, 1:35 PM

## 2020-08-09 NOTE — ED Notes (Signed)
GIVEN BREAKFAST  

## 2020-08-09 NOTE — ED Notes (Signed)
Pt asleep in bed. Respirations even and unlabored. Will continue to monitor for safety. ?

## 2020-08-09 NOTE — ED Notes (Signed)
Pt discharged in no acute distress. Verbalized understanding of discharge instructions provided and resources given. Pt discharged with family by pt side to transport to home. Safety maintained.

## 2020-08-09 NOTE — Discharge Instructions (Signed)
Take all medications as prescribed. Keep all follow-up appointments as scheduled.  Do not consume alcohol or use illegal drugs while on prescription medications. Report any adverse effects from your medications to your primary care provider promptly.  In the event of recurrent symptoms or worsening symptoms, call 911, a crisis hotline, or go to the nearest emergency department for evaluation.   

## 2020-08-10 ENCOUNTER — Encounter (HOSPITAL_COMMUNITY): Payer: Self-pay | Admitting: Psychiatry

## 2020-08-10 ENCOUNTER — Other Ambulatory Visit: Payer: Self-pay

## 2020-08-10 ENCOUNTER — Inpatient Hospital Stay (HOSPITAL_COMMUNITY)
Admission: RE | Admit: 2020-08-10 | Discharge: 2020-08-28 | DRG: 885 | Disposition: A | Discharge status: Home / Self Care | Payer: BC Managed Care – PPO | Attending: Emergency Medicine | Admitting: Emergency Medicine

## 2020-08-10 DIAGNOSIS — F411 Generalized anxiety disorder: Secondary | ICD-10-CM | POA: Diagnosis present

## 2020-08-10 DIAGNOSIS — N400 Enlarged prostate without lower urinary tract symptoms: Secondary | ICD-10-CM | POA: Diagnosis present

## 2020-08-10 DIAGNOSIS — F333 Major depressive disorder, recurrent, severe with psychotic symptoms: Principal | ICD-10-CM | POA: Diagnosis present

## 2020-08-10 DIAGNOSIS — G4700 Insomnia, unspecified: Secondary | ICD-10-CM | POA: Diagnosis present

## 2020-08-10 DIAGNOSIS — Z818 Family history of other mental and behavioral disorders: Secondary | ICD-10-CM | POA: Diagnosis not present

## 2020-08-10 DIAGNOSIS — R45851 Suicidal ideations: Secondary | ICD-10-CM | POA: Diagnosis present

## 2020-08-10 DIAGNOSIS — R7303 Prediabetes: Secondary | ICD-10-CM | POA: Diagnosis present

## 2020-08-10 DIAGNOSIS — Z811 Family history of alcohol abuse and dependence: Secondary | ICD-10-CM | POA: Diagnosis not present

## 2020-08-10 DIAGNOSIS — Z79899 Other long term (current) drug therapy: Secondary | ICD-10-CM | POA: Diagnosis not present

## 2020-08-10 DIAGNOSIS — Z20822 Contact with and (suspected) exposure to covid-19: Secondary | ICD-10-CM | POA: Diagnosis present

## 2020-08-10 DIAGNOSIS — R39198 Other difficulties with micturition: Secondary | ICD-10-CM | POA: Diagnosis not present

## 2020-08-10 DIAGNOSIS — F32A Depression, unspecified: Secondary | ICD-10-CM | POA: Diagnosis present

## 2020-08-10 DIAGNOSIS — F332 Major depressive disorder, recurrent severe without psychotic features: Secondary | ICD-10-CM | POA: Diagnosis present

## 2020-08-10 LAB — PROLACTIN: Prolactin: 6.5 ng/mL (ref 4.0–15.2)

## 2020-08-10 MED ORDER — HYDROXYZINE HCL 25 MG PO TABS
25.0000 mg | ORAL_TABLET | Freq: Three times a day (TID) | ORAL | Status: DC | PRN
  Administered 2020-08-10 – 2020-08-13 (×5): 25 mg via ORAL
  Filled 2020-08-10 (×5): qty 1

## 2020-08-10 MED ORDER — TRAZODONE HCL 50 MG PO TABS
50.0000 mg | ORAL_TABLET | Freq: Every evening | ORAL | Status: DC | PRN
  Administered 2020-08-10 – 2020-08-11 (×4): 50 mg via ORAL
  Filled 2020-08-10 (×12): qty 1

## 2020-08-10 MED ORDER — RISPERIDONE 1 MG PO TBDP
1.0000 mg | ORAL_TABLET | ORAL | Status: AC
  Administered 2020-08-10: 1 mg via ORAL
  Filled 2020-08-10 (×2): qty 1

## 2020-08-10 MED ORDER — ALUM & MAG HYDROXIDE-SIMETH 200-200-20 MG/5ML PO SUSP: 30.0000 mL | ORAL | Status: DC | PRN

## 2020-08-10 MED ORDER — RISPERIDONE 1 MG PO TBDP: 1.0000 mg | ORAL_TABLET | ORAL | Status: DC

## 2020-08-10 MED ORDER — ENSURE ENLIVE PO LIQD
237.0000 mL | Freq: Two times a day (BID) | ORAL | Status: DC
  Administered 2020-08-11 – 2020-08-28 (×32): 237 mL via ORAL
  Filled 2020-08-10 (×39): qty 237

## 2020-08-10 MED ORDER — MAGNESIUM HYDROXIDE 400 MG/5ML PO SUSP: 30.0000 mL | Freq: Every day | ORAL | Status: DC | PRN

## 2020-08-10 MED ORDER — ACETAMINOPHEN 325 MG PO TABS
650.0000 mg | ORAL_TABLET | Freq: Four times a day (QID) | ORAL | Status: DC | PRN
  Administered 2020-08-15 – 2020-08-20 (×5): 650 mg via ORAL
  Filled 2020-08-10 (×7): qty 2

## 2020-08-10 NOTE — Tx Team (Signed)
Initial Treatment Plan 08/10/2020 8:59 PM [name] HWK:[id number]    PATIENT STRESSORS: Other: mental illness   PATIENT STRENGTHS: Average or above average intelligence Capable of independent living Communication skills Physical Health Supportive family/friends Work skills   PATIENT IDENTIFIED PROBLEMS: Suicidal ideation    "rapid thoughts scare me a lot"    Paranoia              DISCHARGE CRITERIA:  Improved stabilization in mood, thinking, and/or behavior Reduction of life-threatening or endangering symptoms to within safe limits Verbal commitment to aftercare and medication compliance  PRELIMINARY DISCHARGE PLAN: Outpatient therapy Return to previous living arrangement Return to previous work or school arrangements  PATIENT/FAMILY INVOLVEMENT: This treatment plan has been presented to and reviewed with the patient, Darryl Wang, and wife, Darryl Wang  The patient and family have been given the opportunity to ask questions and make suggestions.  [name], RN 08/10/2020, 8:59 PM

## 2020-08-10 NOTE — Progress Notes (Signed)
Patient is a 55 year old male who presented this afternoon as a walk in accompanied by his wife, who was able to interpret for pt. Pt does speak a little [place]. Pt presented today as very anxious, restless, unable to sit still through much of the admission process. Per pt's wife, Doroteo Bradford, pt has been having panic attacks, not sleeping, and experiencing racing thoughts. Pt's wife stated, "I'm concerned he will do something to hurt himself". Per wife, pt had made suicidal statements stating that he wanted to "jump into a lake", or "stab himself". Pt currently denies SI/HI, but does appear to be responding to internal stimuli during admission interview.  VS obtained and were WNL. Skin assessment revealed no abnormalities. Admission consents were signed.Pt oriented to unit . Q 15 min checks initiated for safety.

## 2020-08-10 NOTE — H&P (Signed)
Behavioral Health Medical Screening Exam  Darryl Wang is a 55 y.o. male.who presented as  A walk-in to [facility] accompanied by his wife. Patient spanish speaking only although wife effluent in [place]. Per review and report, patient was assessed at the [facility] on 08/08/2020 where he presented with a similar presentation. Patient was observed overnight then re-evaluated  By psychiatry and discharged. On presentation today, patient and wife reported worsening mental health state. Wife reported that patient is paranoid, pacing throughout the night, severely anxious, not eating or sleeping, not taking care of his personal hygiene,  talking to himself and responding, and," out of touch with reality." As per wife, she is concerned for his safety. Patient endorsed the same. He added that he was suicidal with a plan jump into a lake.When asked about AVH, patient reported that he hears his stomach singing songs and that he sees people going around in circles. Patient reported that he is fearful of becoming sick with cancer or prostate issues. As per wife, patient  Had prostate issues years ago although recently he became hyper focused on it. Patient denied substance abuse or use and per chart review, his last UDS was negative. Patient denied current HI. As per wife, patient is complaint with current medications, Wellbutrin, Trazodone and hydroxizine although she stated that she felt as though the medication were not helpful. Wife reported that patient had an appointment today with his outpatient psychiatric provider although she felt as though patient was in a crisis as evidence by his presenting symptoms so she brought him to [facility] for evaluation.   Total Time spent with patient: 20 minutes  Psychiatric Specialty Exam:  Presentation  General Appearance: Fairly Groomed  Eye Contact:Minimal  Speech:-- (spanish speaking only)  Speech Volume:Normal  Handedness:Right   Mood and Affect  Mood:Anxious  Affect:Other  (comment)   Thought Process  Thought Processes:Coherent  Descriptions of Associations:Intact  Orientation:Full (Time, Place and Person)  Thought Content:Paranoid Ideation  History of Schizophrenia/Schizoaffective disorder:No  Duration of Psychotic Symptoms:No data recorded Hallucinations:Hallucinations: Auditory; Visual Description of Auditory Hallucinations: See above Description of Visual Hallucinations: See above  Ideas of Reference:Paranoia  Suicidal Thoughts:Suicidal Thoughts: Yes, Active SI Active Intent and/or Plan: With Plan; Without Intent  Homicidal Thoughts:Homicidal Thoughts: No   Sensorium  Memory:Immediate Fair; Recent Fair; Remote Fair  Judgment:Fair  Insight:Fair   Executive Functions  Concentration:Fair  Attention Span:Fair  Industry   Psychomotor Activity  Psychomotor Activity:Psychomotor Activity: Restlessness   Assets  Assets:Desire for Improvement; Housing; Social Support   Sleep  Sleep:Sleep: Poor    Physical Exam: Physical Exam Psychiatric:     Comments: Thought content; paranoia, AVH, suicidal thoughts      Review of Systems  Psychiatric/Behavioral: Positive for hallucinations and suicidal ideas. The patient is nervous/anxious.    Blood pressure (!) 150/78, pulse 95, temperature 98.4 F (36.9 C), temperature source Oral, resp. rate 20, SpO2 99 %. There is no height or weight on file to calculate BMI.  Musculoskeletal: Strength & Muscle Tone: within normal limits Gait & Station: normal Patient leans: N/A   Recommendations:  Based on my observation, inpatient psychiatric admission is reccommended. There are no appropriate beds here at [facility]. Patient will be transported to the [facility] until a bed is available. Case has been staffed with [facility] provider.   Mordecai Maes, NP 08/10/2020, 3:09 PM

## 2020-08-10 NOTE — Progress Notes (Signed)
Psychoeducational Group Note  Date:  08/10/2020 Time: 2000 Group Topic/Focus:  Wrap up group  Participation Level: Did Not Attend  Participation Quality:  Not Applicable  Affect:  Not Applicable  Cognitive:  Not Applicable  Insight:  Not Applicable  Engagement in Group: Not Applicable  Additional Comments:  Did not attend.   Shellia Cleverly 08/10/2020, 10:14 PM
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
## 2020-08-10 NOTE — Progress Notes (Signed)
Notified by nursing staff that patient was reporting some lightheadedness. Patient assessed by myself at patient's bedside with RN present and with the use of spanish-speaking phone interpreter Hollie Salk, [phone]). Patient reports lightheadedness and severe anxiety. He also endorses intermittent visual hallucinations of "seeing objects", but does not provide further details regarding this. He denies chest pain, shortness of breath, headache, dizziness, abdominal pain, nausea, vomiting, or any additional physical symptoms on exam. Vitals re-checked: BP 133/86 mmHg, pulse 90 bpm, resp 20, SPO2 98%, Temp 98.1 degrees F. Based on patient's presentation and vitals, do not believe that patient is experiencing an emergency medical condition at this time. Patient has had one dose of Trazodone 50 mg PO at 2205 on 08/10/20 (Trazodone currently ordered for 50 mg PO at bedtime and repeat x 1 PRN) and had one time dose of Risperdal disintegrating tablet 1 mg PO at 1844 on 08/10/20. Patient has Vistaril 25 mg PO TID PRN ordered for anxiety, but has not had any Vistaril yet. Will give one dose of Vistaril 25 mg PO now for patient's reported anxiety and will give patient fluids to drink to help with his lightheadedness. If needed, will give repeat dose of Trazodone 50 mg PO for sleep before 0200 on 08/11/20 if needed if patient continues to have trouble sleeping. Patient to notify nursing if any additional issues arise.

## 2020-08-11 DIAGNOSIS — F333 Major depressive disorder, recurrent, severe with psychotic symptoms: Secondary | ICD-10-CM | POA: Diagnosis not present

## 2020-08-11 MED ORDER — ZIPRASIDONE MESYLATE 20 MG IM SOLR
20.0000 mg | INTRAMUSCULAR | Status: AC | PRN
  Administered 2020-08-12: 20 mg via INTRAMUSCULAR
  Filled 2020-08-11: qty 20

## 2020-08-11 MED ORDER — QUETIAPINE FUMARATE 50 MG PO TABS
50.0000 mg | ORAL_TABLET | Freq: Every day | ORAL | Status: DC
  Administered 2020-08-11: 50 mg via ORAL
  Filled 2020-08-11 (×3): qty 1

## 2020-08-11 MED ORDER — MIRTAZAPINE 15 MG PO TABS
15.0000 mg | ORAL_TABLET | Freq: Every day | ORAL | Status: DC
Start: 2020-08-11 — End: 2020-08-13
  Administered 2020-08-11 – 2020-08-12 (×2): 15 mg via ORAL
  Filled 2020-08-11 (×3): qty 1

## 2020-08-11 MED ORDER — LORAZEPAM 1 MG PO TABS
1.0000 mg | ORAL_TABLET | ORAL | Status: AC | PRN
  Administered 2020-08-11: 1 mg via ORAL
  Filled 2020-08-11: qty 1

## 2020-08-11 MED ORDER — OLANZAPINE 10 MG PO TBDP
ORAL_TABLET | ORAL | Status: AC
  Filled 2020-08-11: qty 1

## 2020-08-11 MED ORDER — OLANZAPINE 10 MG PO TBDP
10.0000 mg | ORAL_TABLET | Freq: Three times a day (TID) | ORAL | Status: DC | PRN
  Administered 2020-08-11 – 2020-08-12 (×2): 10 mg via ORAL
  Filled 2020-08-11: qty 1

## 2020-08-11 MED ORDER — SERTRALINE HCL 25 MG PO TABS
ORAL_TABLET | ORAL | Status: AC
  Filled 2020-08-11: qty 1

## 2020-08-11 MED ORDER — SERTRALINE HCL 25 MG PO TABS
25.0000 mg | ORAL_TABLET | Freq: Every day | ORAL | Status: DC
  Administered 2020-08-11: 25 mg via ORAL
  Filled 2020-08-11 (×2): qty 1

## 2020-08-11 NOTE — BHH Suicide Risk Assessment (Signed)
[facility] INPATIENT:  Family/Significant Other Suicide Prevention Education  Suicide Prevention Education:  Education Completed; Wife Tally Due [phone] (was in person),  (name of family member/significant other) has been identified by the patient as the family member/significant other with whom the patient will be residing, and identified as the person(s) who will aid the patient in the event of a mental health crisis (suicidal ideations/suicide attempt).  With written consent from the patient, the family member/significant other has been provided the following suicide prevention education, prior to the and/or following the discharge of the patient.  Letter for employer was provided to wife.  She explained the symptoms patient was having, which were the same that were shared by patient earlier today with interview with CSW, doctor, and nurse practitioner.  It was observed as a positive that he is sharing freely with his wife what is going on with him mentally.  She stated he is on 12 weeks of FMLA currently.  She had already arranged for him to see a psychiatrist at [facility] on 5/14, but he ended up in the hospital instead.  She also arranged for him to see a therapist on 5/20, does not remember the person's name, will be calling tomorrow with that information.  A Spanish-language SPE brochure was provided to her and was reviewed, particularly the risk factors, warning signs, and what to do.  She stated she does feel comfortable asking him if he is thinking of hurting himself, as she did this prior to bringing him to the hospital.  Wife does not need an interpreter.  The suicide prevention education provided includes the following:  Suicide risk factors  Suicide prevention and interventions  National Suicide Hotline telephone number  [facility] assessment telephone number  [facility] Emergency Assistance [place] and/or Residential Mobile Crisis Unit telephone  number  Request made of family/significant other to:  Remove weapons (e.g., guns, rifles, knives), all items previously/currently identified as safety concern.    Remove drugs/medications (over-the-counter, prescriptions, illicit drugs), all items previously/currently identified as a safety concern.  The family member/significant other verbalizes understanding of the suicide prevention education information provided.  The family member/significant other agrees to remove the items of safety concern listed above.  Berlin Hun Grossman-Orr 08/11/2020, 2:48 PM

## 2020-08-11 NOTE — H&P (Signed)
Psychiatric Admission Assessment Adult  Patient Identification: Darryl Wang MRN:  [id number] Date of Evaluation:  08/11/2020 Chief Complaint:  MDD (major depressive disorder), recurrent severe, without psychosis ([place]) [F33.2] Principal Diagnosis: MDD (major depressive disorder), recurrent, severe, with psychosis ([facility]) Diagnosis:  Principal Problem:   MDD (major depressive disorder), recurrent, severe, with psychosis ([place]) Active Problems:   Anxiety state  History of Present Illness: (from NP's walk-in assessment on 5/14): Darryl Wang is a 55 y.o. male.who presented as  A walk-in to [facility] accompanied by his wife. Patient spanish speaking only although wife effluent in [place]. Per review and report, patient was assessed at the [facility] on 08/08/2020 where he presented with a similar presentation. Patient was observed overnight then re-evaluated  By psychiatry and discharged. On presentation today, patient and wife reported worsening mental health state. Wife reported that patient is paranoid, pacing throughout the night, severely anxious, not eating or sleeping, not taking care of his personal hygiene,  talking to himself and responding, and," out of touch with reality." As per wife, she is concerned for his safety. Patient endorsed the same. He added that he was suicidal with a plan jump into a lake.When asked about AVH, patient reported that he hears his stomach singing songs and that he sees people going around in circles. Patient reported that he is fearful of becoming sick with cancer or prostate issues. As per wife, patient  Had prostate issues years ago although recently he became hyper focused on it. Patient denied substance abuse or use and per chart review, his last UDS was negative. Patient denied current HI. As per wife, patient is complaint with current medications, Wellbutrin, Trazodone and hydroxizine although she stated that she felt as though the medication were not helpful. Wife reported that  patient had an appointment today with his outpatient psychiatric provider although she felt as though patient was in a crisis as evidence by his presenting symptoms so she brought him to [facility] for evaluation.   Objective: Patient was seen and evaluated in his room. Patient is Spanish speaking and present during the assessment were Janette, MHT and Spanish Interpreter, Selmer Dominion CSW, Dr [name], and this Probation officer. This represents the first psychiatric hospital admission for this 55 year old Hispanic male. Patient is married and lives with his wife of 27 years and 3 children. He works full time. Patient stated he has been in a state of anxiety for several weeks. He attributes some of his anxiety to relationship issues between he and his wife. He stated she has told him she wants to separate. He stated he was put on Wellbutrin about 2 weeks ago by his doctor. Ever since he started taking this medication he has been more restless, anxious and has been seeing objects (he does not describe them) and hearing "music coming from his stomach." He stated he feels like he is out of touch with reality. He has a lot of difficulty trying to put his symptoms into a time frame. He stated "my wife knows all of this." He stated his wife stopped giving him the medication when she saw what it was doing to him. He stated he has suicidal thoughts with a plan to go [place] the [place] near their apartment. He has never gone into the lake or previously attempted suicide. He stated he feels restless, has racing thoughts, is not sleeping well, not eating well, and has had to take a leave form work. He stated he knows the intrusive thoughts are not real.  He feels as if he loses the notion of time. He stated he forgets if he has showered and his wife has to remind him. He denies feeling paranoid or feeling as if he is receiving messages or signals meant only for him. He appears to be in distress and is frequently seen pacing on the unit  and talking to himself.  Patient denies a history of trauma or abuse. He denies access to weapons. He is [employer] by [employer]. He denies alcohol and drug use. Hs UDS was negative. He stated he has tried other antidepressants in the past. He stated he stops taking them if they don't help or if he feels better. Chart reveals he has been on Prozac, which made him anxious and Zoloft and Paxil which did not help. We will try Remeron 15 mg at bedtime, given to below history of medications and until collateral form his wife can be obtained. He was given a dose of Zoloft today before it was known that he has been on this medication in the past. This was discontinued. He also received Zyprexa 10 mg PO at 12:06 PM  for agitation and restlessness. He is currently in the cafeteria eating dinner.    Per chart review: He  presented to [facility] on 5/1 and 5/12-5/13, he was seen and discharged with resources and no medication changes. He had the same or similar complaints; extreme anxiety, pacing, restless, and feeling as if his stomach was talking to him. He had an outpatient appointment with psychiatry at [facility] on 5/14 but missed it due to being in the hospital. He was diagnosed with depression 15 years ago and has been on multiple antidepressants; Paxil - affected libido, Prozac made him anxious at higher doses, Celexa, Effexor and previously was on Wellbutrin 2014 or 2015 and ddi not tolerate it. He seems to stop taking his medication if he feels they are not working as well.    Associated Signs/Symptoms: Depression Symptoms:  depressed mood, insomnia, feelings of worthlessness/guilt, impaired memory, suicidal thoughts with specific plan, anxiety, loss of energy/fatigue, Duration of Depression Symptoms: Greater than two weeks  (Hypo) Manic Symptoms:  NOne observed, patient denies Anxiety Symptoms:  Panic Symptoms, pacing, restless Psychotic Symptoms:  Hallucinations: Auditory Visual sees objects(did not  describe), hears music coming from his stomach Feels out of touch with reality PTSD Symptoms: Negative denies history of trauma or abuse Total Time spent with patient: 1 hour  Past Psychiatric History: Depression and anxiety  Is the patient at risk to self? Yes.    Has the patient been a risk to self in the past 6 months? No.  Has the patient been a risk to self within the distant past? No.  Is the patient a risk to others? No.  Has the patient been a risk to others in the past 6 months? No.  Has the patient been a risk to others within the distant past? No.   Prior Inpatient Therapy:  None Prior Outpatient Therapy:  Many years ago he saw a therapist for depression  Alcohol Screening: 1. How often do you have a drink containing alcohol?: Never 2. How many drinks containing alcohol do you have on a typical day when you are drinking?: 1 or 2 3. How often do you have six or more drinks on one occasion?: Never AUDIT-C Score: 0 4. How often during the last year have you found that you were not able to stop drinking once you had started?: Never 5. How often during  the last year have you failed to do what was normally expected from you because of drinking?: Never 6. How often during the last year have you needed a first drink in the morning to get yourself going after a heavy drinking session?: Never 7. How often during the last year have you had a feeling of guilt of remorse after drinking?: Never 8. How often during the last year have you been unable to remember what happened the night before because you had been drinking?: Never 9. Have you or someone else been injured as a result of your drinking?: No 10. Has a relative or friend or a doctor or another health worker been concerned about your drinking or suggested you cut down?: No Alcohol Use Disorder Identification Test Final Score (AUDIT): 0 Substance Abuse History in the last 12 months:  No. Consequences of Substance  Abuse: NA Previous Psychotropic Medications: No  Psychological Evaluations: No  Past Medical History:  Past Medical History:  Diagnosis Date  . Depression    previously treated with zoloft (not helpful) and prozac (made him anxious). Was under care of male therapist, about 7 yrs ago.   History reviewed. No pertinent surgical history. Family History:  Family History  Problem Relation Age of Onset  . Arthritis Mother   . Depression Mother   . Alcohol abuse Father   . Diabetes Father   . Kidney disease Brother   . Depression Maternal Grandmother    Family Psychiatric  History: Father with alcoholism Tobacco Screening:  Patient does not smoke Social History:  Social History   Substance and Sexual Activity  Alcohol Use No     Social History   Substance and Sexual Activity  Drug Use No    Additional Social History: Marital status: Married What types of issues is patient dealing with in the relationship?: Per wife, her and the pt where having relationship problems and talking about separating. What is your sexual orientation?: Not assessed. Has your sexual activity been affected by drugs, alcohol, medication, or emotional stress?: Not assessed. Does patient have children?: Yes How many children?: 5    Pain Medications: See MAR Prescriptions: See MAR Over the Counter: See MAR History of alcohol / drug use?: No history of alcohol / drug abuse (Pt denies.)     Allergies:   Allergies  Allergen Reactions  . Penicillins Hives   Lab Results: No results found for this or any previous visit (from the past 48 hour(s)).  Blood Alcohol level:  Lab Results  Component Value Date   ETH <10 08/08/2020    Metabolic Disorder Labs:  Lab Results  Component Value Date   HGBA1C 6.2 (H) 08/08/2020   MPG 131.24 08/08/2020   Lab Results  Component Value Date   PROLACTIN 6.5 08/08/2020   Lab Results  Component Value Date   CHOL 245 (H) 08/08/2020   TRIG 204 (H) 08/08/2020    HDL 75 08/08/2020   CHOLHDL 3.3 08/08/2020   VLDL 41 (H) 08/08/2020   LDLCALC 129 (H) 08/08/2020   LDLCALC 137 (H) 05/23/2010    Current Medications: Current Facility-Administered Medications  Medication Dose Route Frequency Provider Last Rate Last Admin  . acetaminophen (TYLENOL) tablet 650 mg  650 mg Oral Q6H PRN Laveda Abbe, NP      . alum & mag hydroxide-simeth (MAALOX/MYLANTA) 200-200-20 MG/5ML suspension 30 mL  30 mL Oral Q4H PRN Laveda Abbe, NP      . feeding supplement (ENSURE ENLIVE / ENSURE PLUS)  liquid 237 mL  237 mL Oral BID BM [name], [name], MD   237 mL at 08/11/20 0935  . hydrOXYzine (ATARAX/VISTARIL) tablet 25 mg  25 mg Oral TID PRN Ethelene Hal, NP   25 mg at 08/11/20 0935  . OLANZapine zydis (ZYPREXA) disintegrating tablet 10 mg  10 mg Oral Q8H PRN Ethelene Hal, NP   10 mg at 08/11/20 1206   And  . LORazepam (ATIVAN) tablet 1 mg  1 mg Oral PRN Ethelene Hal, NP       And  . ziprasidone (GEODON) injection 20 mg  20 mg Intramuscular PRN Ethelene Hal, NP      . magnesium hydroxide (MILK OF MAGNESIA) suspension 30 mL  30 mL Oral Daily PRN Ethelene Hal, NP      . OLANZapine zydis (ZYPREXA) 10 MG disintegrating tablet           . QUEtiapine (SEROQUEL) tablet 50 mg  50 mg Oral QHS Ethelene Hal, NP      . sertraline (ZOLOFT) 25 MG tablet           . sertraline (ZOLOFT) tablet 25 mg  25 mg Oral Daily Ethelene Hal, NP   25 mg at 08/11/20 1206  . traZODone (DESYREL) tablet 50 mg  50 mg Oral QHS,MR X 1 Ethelene Hal, NP   50 mg at 08/11/20 7741   PTA Medications: Medications Prior to Admission  Medication Sig Dispense Refill Last Dose  . buPROPion (WELLBUTRIN XL) 150 MG 24 hr tablet Take 150 mg by mouth every morning.     . hydrOXYzine (ATARAX/VISTARIL) 25 MG tablet Take 1 tablet (25 mg total) by mouth every 6 (six) hours as needed for anxiety or vomiting. (Patient taking differently: Take  25 mg by mouth 3 (three) times daily as needed for anxiety.) 30 tablet 0   . traZODone (DESYREL) 50 MG tablet Take 1 tablet (50 mg total) by mouth at bedtime and may repeat dose one time if needed. 15 tablet 0     Musculoskeletal: Strength & Muscle Tone: within normal limits Gait & Station: normal Patient leans: N/A  Psychiatric Specialty Exam:  Presentation  General Appearance: Appropriate for Environment; Casual  Eye Contact:Fair  Speech:Other (comment) (Spanish speaking, needs interpreter)  Speech Volume:Normal  Handedness:Right  Mood and Affect  Mood:Anxious; Depressed  Affect:Congruent; Depressed  Thought Process  Thought Processes:Coherent  Duration of Psychotic Symptoms: No data recorded Past Diagnosis of Schizophrenia or Psychoactive disorder: No  Descriptions of Associations:Intact  Orientation:Full (Time, Place and Person)  Thought Content:Paranoid Ideation; Rumination  Hallucinations:Hallucinations: Auditory; Visual Description of Auditory Hallucinations: hearing music he described as coming from his stomach Description of Visual Hallucinations: sees objects but does not describe them  Ideas of Reference:Paranoia  Suicidal Thoughts:Suicidal Thoughts: No (denies SI today) SI Active Intent and/or Plan: With Plan; Without Intent  Homicidal Thoughts:Homicidal Thoughts: No  Sensorium  Memory:Immediate Fair; Recent Fair; Remote [place]  Insight:Fair  Executive Functions  Concentration:Fair  Attention Span:Fair  [place]  Language:Good ([place] speaking, needs an interpreter)  Psychomotor Activity  Psychomotor Activity:Psychomotor Activity: Restlessness; Increased  Assets  Assets:Desire for Improvement; Housing; Financial Resources/Insurance; Intimacy; Physical Health; Social Support; Agricultural consultant; Armed forces logistics/support/administrative officer  Sleep  Sleep:Sleep: Poor (per patient, new  admission)  Physical Exam: Physical Exam Constitutional:      Appearance: Normal appearance.  HENT:     Head: Normocephalic.  Pulmonary:     Effort: Pulmonary  effort is normal.  Musculoskeletal:        General: Normal range of motion.     Cervical back: Normal range of motion.  Neurological:     Mental Status: He is alert and oriented to person, place, and time.    ROS Blood pressure 133/86, pulse 90, temperature 98.1 F (36.7 C), temperature source Oral, resp. rate 20, height 5\' 5"  (1.651 m), weight 60.3 kg, SpO2 98 %. Body mass index is 22.13 kg/m.  Treatment Plan Summary: 1. Admit for crisis management and stabilization, estimated length of stay 3-5 days.    2. Medication management to reduce current symptoms to base line and improve the patient's overall level of functioning: See MAR, Md's SRA & treatment plan.   Observation Level/Precautions:  15 minute checks  Laboratory:  Labs reviewed: CMP with sodium 134 otherwise normal, CBC with diff - WBC 11.2, lymphocyte # 4.2, Lipid panel Cholesterol 245, LDL 129, Triglycerides 204. A1c 6.2, UDS and BAL negative. TSH 2.156. COVID & Influenza negative  Psychotherapy:  Group Therapy   Medications:  See MAR  Consultations:  TBD  Discharge Concerns:  Safety, suicidal ideation, medication compliance  Estimated LOS: 5-7 days  Other:     Physician Treatment Plan for Primary Diagnosis: MDD (major depressive disorder), recurrent, severe, with psychosis ([place]) Long Term Goal(s): Improvement in symptoms so as ready for discharge  Short Term Goals: Ability to identify changes in lifestyle to reduce recurrence of condition will improve, Ability to verbalize feelings will improve, Ability to disclose and discuss suicidal ideas, Ability to identify and develop effective coping behaviors will improve, Compliance with prescribed medications will improve and Ability to identify triggers associated with substance abuse/mental health issues will  improve  Physician Treatment Plan for Secondary Diagnosis: Principal Problem:   MDD (major depressive disorder), recurrent, severe, with psychosis ([place]) Active Problems:   Anxiety state  Long Term Goal(s): Improvement in symptoms so as ready for discharge  Short Term Goals: Ability to identify changes in lifestyle to reduce recurrence of condition will improve, Ability to verbalize feelings will improve, Ability to disclose and discuss suicidal ideas, Ability to identify and develop effective coping behaviors will improve, Compliance with prescribed medications will improve and Ability to identify triggers associated with substance abuse/mental health issues will improve  I certify that inpatient services furnished can reasonably be expected to improve the patient's condition.    Ethelene Hal, NP 5/15/202212:14 PM

## 2020-08-11 NOTE — Progress Notes (Signed)
   08/11/20 0600  Psych Admission Type (Psych Patients Only)  Admission Status Voluntary  Psychosocial Assessment  Patient Complaints Anxiety  Eye Contact Fair  Facial Expression Sad  Affect Appropriate to circumstance  Speech Soft  Interaction Minimal  Motor Activity Pacing  Appearance/Hygiene Unremarkable  Behavior Characteristics Pacing  Mood Anxious;Pleasant  Thought Process  Coherency WDL  Content WDL  Delusions None reported or observed  Perception WDL  Hallucination None reported or observed  Judgment WDL  Confusion None  Danger to Self  Current suicidal ideation? Denies  Danger to Others  Danger to Others None reported or observed   D: Patient is alert and oriented x 4. Patient denies SI/HI/ AVH and pain, but reports severe anxiety. Disposition is Calm and cooperative with appropriate affect. Verbally contracts for safety to this Probation officer via language interpreter service for [place] speaking.  A:  Pt was given scheduled medications. Pt was encourage to attend groups. Q 15 minute checks were done for safety. Pt was offered support and encouragement by this Probation officer.  Pt complied with scheduled medications.Pt remains receptive to treatment and safety maintained on unit.    R: Will continue to monitor and assess. Safety maintained during this shift.
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
## 2020-08-11 NOTE — Progress Notes (Signed)
Psychoeducational Group Note  Date:  08/11/2020 Time: 2015 Group Topic/Focus:  wrap up group  Participation Level: Did Not Attend  Participation Quality:  Not Applicable  Affect:  Not Applicable  Cognitive:  Not Applicable  Insight:  Not Applicable  Engagement in Group: Not Applicable  Additional Comments:  Did not attend.  Shellia Cleverly 08/11/2020, 10:14 PM

## 2020-08-11 NOTE — BHH Suicide Risk Assessment (Addendum)
[facility] Admission Suicide Risk Assessment   Nursing information obtained from:  Patient Demographic factors:  Male Current Mental Status:  Suicidal ideation indicated by others Loss Factors:  Leave of absence from work Historical Factors:  Previous h/o depression treatment; family h/o mental health issues Risk Reduction Factors:  Responsible for children under 53 years of age,Sense of responsibility to family,Employed,Living with another person, especially a relative,Positive social support,Positive therapeutic relationship  Total Time Spent in Direct Patient Care:  I personally spent 60 minutes on the unit in direct patient care. The direct patient care time included face-to-face time with the patient, reviewing the patient's chart, communicating with other professionals, and coordinating care. Greater than 50% of this time was spent in counseling or coordinating care with the patient regarding goals of hospitalization, psycho-education, and discharge planning needs.  Principal Problem: MDD (major depressive disorder), recurrent, severe, with psychosis ([place]) Diagnosis:  Principal Problem:   MDD (major depressive disorder), recurrent, severe, with psychosis ([place]) Active Problems:   Anxiety state  Subjective Data: Patient was interviewed with in-person interpreter in the presence of APP and social work. He reports a remote h/o depression years ago for which he was on an antidepressant that he does not recall the name of. Per his ED assessment, he was treated for depression 15 years ago at which time he was tried on Prozac but he could not tolerate the medication due to side-effects.  He states he stopped the antidepressant years ago when he was feeling better, but intermittently in the last several years, he has used homeopathic treatment for anxiety and depression including questionable valerian root and St. John's Wort. He reports that in the last few months he started having increased anxiety,  ruminations, and depressed mood. He thinks that his main stressor has been relationship worries in his marriage and his wife's threats for separation. He states a primary care provider recently started him on a new antidepressant, and per EHR he has been on Wellbutrin, Vistaril, and Trazodone or the last week. He states that in the last week he has been pacing at home, is restless, cannot sleep well, and has been feeling extremely anxious. He reports anhedonia, poor focus, and poor appetite associated with his depression and anxiety. He states that in the context of his mood issues he has taken a leave of absence from work and is now stressed due to reduced pay that is coming in while he is on medical leave. Recently he states he has had thoughts of suicide by potentially drowning in a lake but has not acted on these thoughts. He denies current SI, intent or plan and denies HI. He states in the last few weeks he has belief that "music is coming from my stomach" and that he is hearing voices. He has been ruminating with worry that he could have a terminal illness based on "what my body is telling me" and states he hears the word "cancer" inside his head. He describes feeling "out of touch with reality" and believes he is losing track of time. Per ED notes, his wife reported that he has so many fears that he does not recall if he has showered, has to be reminded to eat, and seems more forgetful and distracted. He denies ideas of reference or first rank symptoms. He denies h/o mania/hypomania and denies h/o alcohol abuse or illicit drug use. He denies h/o TBI or seizures and denies PMH. He denies past suicide attempts or previous psychiatric inpatient admissions and denies known  suicides in the family. He has 2 children with a h/o depression and states his father was an alcoholic. See H&P for additional details.   Continued Clinical Symptoms:  Alcohol Use Disorder Identification Test Final Score (AUDIT): 0 The  "Alcohol Use Disorders Identification Test", Guidelines for Use in Primary Care, Second Edition.  World Pharmacologist [facility]). Score between 0-7:  no or low risk or alcohol related problems. Score between 8-15:  moderate risk of alcohol related problems. Score between 16-19:  high risk of alcohol related problems. Score 20 or above:  warrants further diagnostic evaluation for alcohol dependence and treatment.  CLINICAL FACTORS:   Severe Anxiety and/or Agitation Depression:   Insomnia Severe Previous Psychiatric Diagnoses and Treatments  Musculoskeletal: Strength & Muscle Tone: within normal limits Gait & Station: normal, steady Patient leans: N/A  Psychiatric Specialty Exam: Physical Exam Vitals reviewed.  HENT:     Head: Normocephalic.  Pulmonary:     Effort: Pulmonary effort is normal.  Neurological:     General: No focal deficit present.     Mental Status: He is alert.     Review of Systems - see H&P  Blood pressure 133/86, pulse 90, temperature 98.1 F (36.7 C), temperature source Oral, resp. rate 20, height 5' 5"  (1.651 m), weight 60.3 kg, SpO2 98 %.Body mass index is 22.13 kg/m.  General Appearance: casually dressed, adequate hygiene  Eye Contact:  Fair  Speech:  speaks Spanish via interpreter  Volume:  Normal  Mood:  Anxious and Dysphoric  Affect: anxious, guarded  Thought Process:  Circumstantial, vague, evasive  Orientation:  Full (Time, Place, and Person)  Thought Content:  endorses AH and ruminations about possible illnesses; denies paranoia, ideas of reference, or first rank symptoms  Suicidal Thoughts:  Denies current SI, intent or plan  Homicidal Thoughts:  No  Memory:  Recent;   Fair - has difficulty providing details; can recall events of 12/09/99 with remote testing  Judgement:  Fair  Insight:  Lacking  Psychomotor Activity:  Increased - prior to interview was pacing in hall  Concentration:  Concentration: Fair and Attention Span: Fair  Recall:   [employer] of Knowledge:  Fair  Language:  Spanish  Akathisia:  Negative  Assets:  Communication Skills Desire for Improvement Housing Resilience Social Support Vocational/Educational  ADL's:  Intact  Cognition:  WNL   COGNITIVE FEATURES THAT CONTRIBUTE TO RISK:  Thought constriction (tunnel vision)    SUICIDE RISK:   Moderate:  Frequent suicidal ideation with limited intensity, and duration, some specificity in terms of plans, no associated intent, good self-control, limited dysphoria/symptomatology, some risk factors present, and identifiable protective factors, including available and accessible social support.  PLAN OF CARE: Patient admitted voluntarily to inpatient psychiatry. Time was spent discussing the r/b/se/a to various antidepressants including risk of developing SI, sexual side-effects, sleep changes, and GI symptoms and he agrees to start an antidepressant to help with anxiety and depression. APP has started him on Zoloft 69m qd initially, but after chart review it appears he has tried this medication along with Paxil, Prozac, Celexa, Effexor, and Wellbutrin in the past with reported limited benefit. He was willing to instead start Remeron 110mfor mood, sleep, and appetite stimulation. We discussed the option of Seroquel to help with sleep, anxiety, mood stability and AH. The r/b/se/a to atypical antipsychotic use including risk of developing TD/EPS, weight gain, DM and high cholesterol were discussed, and he consents to med trial. APP started him on Seroquel 5033m  qhs. We will attempt to get in touch with wife with his signed consent for collateral history. Admission labs were reviewed: Respiratory panel negative; WBC 11.2, H/H 15.3/43.8, platelets 309; CMP WNL except for Na+ 134; A1c 6.2; Mag+ 2.4; ETOH <10; cholesterol 245, triglycerides 204, HDL 75, LDL 129 (unclear if was fasting); TSH 2.156; prolactin 6.5; UDS negative; UA is pending.Will recheck CBC for trending of WBC and  recheck CMP for trending of Na+; will recheck fasting lipid panel and EKG. In the context of new onset psychosis, will check noncontrast Head CT, B12, ANA, ESR, ceruloplasmin, HIV and RPR.   I certify that inpatient services furnished can reasonably be expected to improve the patient's condition.   Harlow Asa, MD , FAPA 08/11/2020, 2:32 PM

## 2020-08-11 NOTE — Progress Notes (Signed)
Progress note    08/11/20 0935  Psych Admission Type (Psych Patients Only)  Admission Status Voluntary  Psychosocial Assessment  Patient Complaints Anxiety;Depression  Eye Contact Fair  Facial Expression Anxious;Pensive  Affect Anxious;Preoccupied  Speech Soft  Interaction Cautious;Forwards little;Guarded;Minimal  Motor Activity Pacing  Appearance/Hygiene Unremarkable  Behavior Characteristics Cooperative;Appropriate to situation;Anxious;Pacing  Mood Anxious;Preoccupied;Pleasant  Thought Process  Coherency WDL  Content Paranoia  Delusions Paranoid  Perception Hallucinations  Hallucination Auditory  Judgment Poor  Confusion None  Danger to Self  Current suicidal ideation? Denies  Danger to Others  Danger to Others None reported or observed

## 2020-08-11 NOTE — BHH Group Notes (Signed)
Adult Psychoeducational Group Not Date:  08/11/2020 Time:  0900-1045 Group Topic/Focus: PROGRESSIVE RELAXATION. A group where deep breathing is taught and tensing and relaxation muscle groups is used. Imagery is used as well.  Pts are asked to imagine 3 pillars that hold them up when they are not able to hold themselves up.  Participation Level:  Did not attend   Paulino Rily

## 2020-08-11 NOTE — Plan of Care (Signed)
  Problem: Education: Goal: Knowledge of [facility] Education information/materials will improve Outcome: Progressing Goal: Emotional status will improve Outcome: Progressing Goal: Mental status will improve Outcome: Progressing Goal: Verbalization of understanding the information provided will improve Outcome: Progressing   

## 2020-08-12 ENCOUNTER — Ambulatory Visit (HOSPITAL_COMMUNITY)
Admit: 2020-08-12 | Discharge: 2020-08-12 | Disposition: A | Discharge status: Home / Self Care | Payer: Self-pay | Attending: Psychiatry | Admitting: Psychiatry

## 2020-08-12 DIAGNOSIS — F29 Unspecified psychosis not due to a substance or known physiological condition: Secondary | ICD-10-CM | POA: Insufficient documentation

## 2020-08-12 LAB — CBC WITH DIFFERENTIAL/PLATELET
Abs Immature Granulocytes: 0.02 10*3/uL (ref 0.00–0.07)
Basophils Absolute: 0 10*3/uL (ref 0.0–0.1)
Basophils Relative: 0 %
Eosinophils Absolute: 0 10*3/uL (ref 0.0–0.5)
Eosinophils Relative: 0 %
HCT: 44.1 % (ref 39.0–52.0)
Hemoglobin: 14.6 g/dL (ref 13.0–17.0)
Immature Granulocytes: 0 %
Lymphocytes Relative: 36 %
Lymphs Abs: 3.8 10*3/uL (ref 0.7–4.0)
MCH: 28.9 pg (ref 26.0–34.0)
MCHC: 33.1 g/dL (ref 30.0–36.0)
MCV: 87.2 fL (ref 80.0–100.0)
Monocytes Absolute: 0.9 10*3/uL (ref 0.1–1.0)
Monocytes Relative: 9 %
Neutro Abs: 5.6 10*3/uL (ref 1.7–7.7)
Neutrophils Relative %: 55 %
Platelets: 266 10*3/uL (ref 150–400)
RBC: 5.06 MIL/uL (ref 4.22–5.81)
RDW: 13.1 % (ref 11.5–15.5)
WBC: 10.3 10*3/uL (ref 4.0–10.5)
nRBC: 0 % (ref 0.0–0.2)

## 2020-08-12 LAB — HIV ANTIBODY (ROUTINE TESTING W REFLEX): HIV Screen 4th Generation wRfx: NONREACTIVE

## 2020-08-12 LAB — SEDIMENTATION RATE: Sed Rate: 1 mm/hr (ref 0–16)

## 2020-08-12 LAB — RPR: RPR Ser Ql: NONREACTIVE

## 2020-08-12 LAB — VITAMIN B12: Vitamin B-12: 201 pg/mL (ref 180–914)

## 2020-08-12 MED ORDER — RISPERIDONE 1 MG PO TBDP
1.0000 mg | ORAL_TABLET | Freq: Every day | ORAL | Status: DC
  Administered 2020-08-12: 1 mg via ORAL
  Filled 2020-08-12 (×3): qty 1

## 2020-08-12 MED ORDER — TRAZODONE HCL 50 MG PO TABS
50.0000 mg | ORAL_TABLET | Freq: Every evening | ORAL | Status: DC | PRN
  Administered 2020-08-12 – 2020-08-16 (×5): 50 mg via ORAL
  Filled 2020-08-12 (×4): qty 1

## 2020-08-12 NOTE — BHH Group Notes (Signed)
Type of Therapy and Topic:Group Therapy: Effective Communication  Participation Level: Attended  Description of Group:  In this group patients will be asked to identify their own styles of communication as well as defining and identifying passive, assertive, and aggressive styles of communication. Participants will identify strategies to communicate in a more assertive manner in an effort to appropriately meet their needs. This group will be process-oriented, with patients participating in exploration of their own experiences as well as giving and receiving support and challenge from other group members.  Therapeutic Goals: 1. Patient will identify their personal communication style. 2. Patient will identify passive, assertive, and aggressive forms of communication. 3. Patient will identify strategies for developing more effective communication to appropriately meet their needs.   Summary of Patient Progress:  CSW provided Darryl Wang with a packet of worksheets and encouraged pt to asks questions 1:1 if any presented.    Therapeutic Modalities:  Communication Skills Solution Focused Therapy Motivational Interviewing

## 2020-08-12 NOTE — BHH Group Notes (Signed)
Pt did not attend group.  ADULT GRIEF GROUP NOTE:   Spiritual care group on grief and loss facilitated by chaplain Janne Napoleon, [facility]   Group Goal:   Support / Education around grief and loss   Members engage in facilitated group support and psycho-social education.   Group Description:   Following introductions and group rules, group members engaged in facilitated group dialog and support around topic of loss, with particular support around experiences of loss in their lives. Group Identified types of loss (relationships / self / things) and identified patterns, circumstances, and changes that precipitate losses. Reflected on thoughts / feelings around loss, normalized grief responses, and recognized variety in grief experience. Group noted Worden's four tasks of grief in discussion.   Group drew on Adlerian / Rogerian, narrative, MI,

## 2020-08-12 NOTE — Progress Notes (Signed)
   08/12/20 0500  Psych Admission Type (Psych Patients Only)  Admission Status Voluntary  Psychosocial Assessment  Eye Contact Fair  Facial Expression Anxious;Pensive  Affect Anxious;Preoccupied  Speech Soft  Interaction Cautious;Forwards little;Guarded;Minimal  Motor Activity Pacing  Appearance/Hygiene Unremarkable  Thought Process  Coherency WDL  Content Paranoia  Delusions Paranoid  Perception Hallucinations  Hallucination Auditory  Judgment Poor  Confusion None  Danger to Self  Current suicidal ideation? Denies  Danger to Others  Danger to Others None reported or observed

## 2020-08-12 NOTE — Progress Notes (Signed)
[facility] MD Progress Note  08/12/2020 4:12 PM Darryl Wang  MRN:  [id number]   Reason for admission:  Darryl Wang de 55-year-old Spanish-speaking male with a history of anxiety and depression for worsening symptoms of depression, increased anxiety, paranoia, hallucinations, responding to internal stimuli, not sleeping, not eating, appearing out of touch with reality and suicidal ideation with a plan to jump in a lake.  Objective: Medical record reviewed.  Patient's case discussed in detail with members of the treatment team.  I met with and evaluated patient this afternoon with the medical student present through the use of Spanish-speaking translator services (3 separate interpreters used due to repeated loss of signal on translation app including: #[id number]; #[id number]; #[id number]).  Patient states that he came to the hospital because he is feeling panic, anxiety, scared and a little depressed.  He reports that he was worried about having cancer or some problem with his prostate.  Over the last 2 weeks he has had difficulty sleeping, difficulty eating, sad mood, pessimistic outlook, thoughts of ending his life by jumping into a lake, trouble enjoying things or getting interested in things, trouble concentrating, decreased energy and memory problems.  The patient also reports auditory hallucinations of his stomach "making music" and "feeling like objects are getting into my head."  The patient is unable to elaborate on this latter statement.  He reports that he feels calmer right now and denies thoughts of harming himself in the hospital.  He denies thoughts of wanting to harm anybody else.  He denies alcohol or drug use prior to admission.  Towards the end of the interview the interpreter states that the final question that patient asked directed toward MD was illogical and did not make sense.  The patient slept 3.75 hours last night.  Vital signs this morning include BP of 110/78 sitting and 121/77 standing, pulse of 76  sitting and 88 standing, respirations 16, O2 sat of 100% and temperature of 97.8.  Head CT has been ordered and performed but the results have not yet been released.  From this morning were reviewed.  HIV testing was nonreactive.  RPR was nonreactive.  Sed Rate was 1.  Vitamin B12 level was 201.  CBC and differential were WNL.  Ceruloplasmin, ANA, lipid panel and repeat CMP have been drawn but results are not yet available.  Nursing notes document that the patient has appeared guarded, anxious and somewhat paranoid on the unit.  He has acknowledged to nursing staff that he has been hearing some voices.  The patient has not been attending groups.   Principal Problem: MDD (major depressive disorder), recurrent, severe, with psychosis ([place]) Diagnosis: Principal Problem:   MDD (major depressive disorder), recurrent, severe, with psychosis ([place]) Active Problems:   Anxiety state  Total Time spent with patient: 25 minutes  Past Psychiatric History: See admission H&P  Past Medical History:  Past Medical History:  Diagnosis Date  . Depression    previously treated with zoloft (not helpful) and prozac (made him anxious). Was under care of male therapist, about 7 yrs ago.   History reviewed. No pertinent surgical history. Family History:  Family History  Problem Relation Age of Onset  . Arthritis Mother   . Depression Mother   . Alcohol abuse Father   . Diabetes Father   . Kidney disease Brother   . Depression Maternal Grandmother    Family Psychiatric  History: See admission H&P Social History:  Social History   Substance and Sexual Activity  Alcohol  Use No     Social History   Substance and Sexual Activity  Drug Use No    Social History   Socioeconomic History  . Marital status: Married    Spouse name: Not on file  . Number of children: Not on file  . Years of education: Not on file  . Highest education level: Not on file  Occupational History  . Not on file  Tobacco Use   . Smoking status: Never Smoker  . Smokeless tobacco: Never Used  Substance and Sexual Activity  . Alcohol use: No  . Drug use: No  . Sexual activity: Not on file  Other Topics Concern  . Not on file  Social History Narrative  . Not on file   Social Determinants of Health   Financial Resource Strain: Not on file  Food Insecurity: Not on file  Transportation Needs: Not on file  Physical Activity: Not on file  Stress: Not on file  Social Connections: Not on file   Additional Social History:    Pain Medications: See MAR Prescriptions: See MAR Over the Counter: See MAR History of alcohol / drug use?: No history of alcohol / drug abuse (Pt denies.)                    Sleep: Poor  Appetite:  Fair  Current Medications: Current Facility-Administered Medications  Medication Dose Route Frequency Provider Last Rate Last Admin  . acetaminophen (TYLENOL) tablet 650 mg  650 mg Oral Q6H PRN Ethelene Hal, NP      . alum & mag hydroxide-simeth (MAALOX/MYLANTA) 200-200-20 MG/5ML suspension 30 mL  30 mL Oral Q4H PRN Ethelene Hal, NP      . feeding supplement (ENSURE ENLIVE / ENSURE PLUS) liquid 237 mL  237 mL Oral BID BM [name], [name], MD   237 mL at 08/11/20 1711  . hydrOXYzine (ATARAX/VISTARIL) tablet 25 mg  25 mg Oral TID PRN Ethelene Hal, NP   25 mg at 08/12/20 0820  . magnesium hydroxide (MILK OF MAGNESIA) suspension 30 mL  30 mL Oral Daily PRN Ethelene Hal, NP      . mirtazapine (REMERON) tablet 15 mg  15 mg Oral QHS Ethelene Hal, NP   15 mg at 08/11/20 2118  . OLANZapine zydis (ZYPREXA) disintegrating tablet 10 mg  10 mg Oral Q8H PRN Ethelene Hal, NP   10 mg at 08/11/20 1206   And  . ziprasidone (GEODON) injection 20 mg  20 mg Intramuscular PRN Ethelene Hal, NP      . risperiDONE (RISPERDAL M-TABS) disintegrating tablet 1 mg  1 mg Oral QHS [name], MD      . traZODone (DESYREL) tablet 50 mg  50 mg Oral  QHS PRN [name], MD        Lab Results:  Results for orders placed or performed during the hospital encounter of 08/10/20 (from the past 48 hour(s))  HIV Antibody (routine testing w rflx)     Status: None   Collection Time: 08/12/20  6:28 AM  Result Value Ref Range   HIV Screen 4th Generation wRfx Non Reactive Non Reactive    Comment: Performed at [facility], [place] [address], [place], [place] [address]  RPR     Status: None   Collection Time: 08/12/20  6:28 AM  Result Value Ref Range   RPR Ser Ql NON REACTIVE NON REACTIVE    Comment: Performed at  [facility], [place] [address], [place], [place] [address]  Sedimentation rate     Status: None   Collection Time: 08/12/20  6:28 AM  Result Value Ref Range   Sed Rate 1 0 - 16 mm/hr    Comment: Performed at [facility], [place] [address], [place], [place] [address]  Vitamin B12     Status: None   Collection Time: 08/12/20  6:28 AM  Result Value Ref Range   Vitamin B-12 201 180 - 914 pg/mL    Comment: (NOTE) This assay is not validated for testing neonatal or myeloproliferative syndrome specimens for Vitamin B12 levels. Performed at [facility], [place] [address] [place], [place] [address]   CBC with Differential/Platelet     Status: None   Collection Time: 08/12/20  6:28 AM  Result Value Ref Range   WBC 10.3 4.0 - 10.5 K/uL   RBC 5.06 4.22 - 5.81 MIL/uL   Hemoglobin 14.6 13.0 - 17.0 g/dL   HCT 44.1 39.0 - 52.0 %   MCV 87.2 80.0 - 100.0 fL   MCH 28.9 26.0 - 34.0 pg   MCHC 33.1 30.0 - 36.0 g/dL   RDW 13.1 11.5 - 15.5 %   Platelets 266 150 - 400 K/uL   nRBC 0.0 0.0 - 0.2 %   Neutrophils Relative % 55 %   Neutro Abs 5.6 1.7 - 7.7 K/uL   Lymphocytes Relative 36 %   Lymphs Abs 3.8 0.7 - 4.0 K/uL   Monocytes Relative 9 %   Monocytes Absolute 0.9 0.1 - 1.0 K/uL   Eosinophils Relative 0 %   Eosinophils Absolute 0.0 0.0 - 0.5 K/uL   Basophils Relative 0 %   Basophils Absolute  0.0 0.0 - 0.1 K/uL   Immature Granulocytes 0 %   Abs Immature Granulocytes 0.02 0.00 - 0.07 K/uL    Comment: Performed at [facility], [place] [address] [place], [place] [address]    Blood Alcohol level:  Lab Results  Component Value Date   ETH <10 96/22/2979    Metabolic Disorder Labs: Lab Results  Component Value Date   HGBA1C 6.2 (H) 08/08/2020   MPG 131.24 08/08/2020   Lab Results  Component Value Date   PROLACTIN 6.5 08/08/2020   Lab Results  Component Value Date   CHOL 245 (H) 08/08/2020   TRIG 204 (H) 08/08/2020   HDL 75 08/08/2020   CHOLHDL 3.3 08/08/2020   VLDL 41 (H) 08/08/2020   LDLCALC 129 (H) 08/08/2020   LDLCALC 137 (H) 05/23/2010    Physical Findings: AIMS: Facial and Oral Movements Muscles of Facial Expression: None, normal Lips and Perioral Area: None, normal Jaw: None, normal Tongue: None, normal,Extremity Movements Upper (arms, wrists, hands, fingers): None, normal Lower (legs, knees, ankles, toes): None, normal, Trunk Movements Neck, shoulders, hips: None, normal, Overall Severity Severity of abnormal movements (highest score from questions above): None, normal Incapacitation due to abnormal movements: None, normal Patient's awareness of abnormal movements (rate only patient's report): No Awareness, Dental Status Current problems with teeth and/or dentures?: No Does patient usually wear dentures?: No  CIWA:    COWS:     Musculoskeletal: Strength & Muscle Tone: within normal limits Gait & Station: normal Patient leans: N/A  Psychiatric Specialty Exam:  Presentation  General Appearance: Appropriate for Environment; Casual  Eye Contact:Fair  Speech:Other (comment); Clear and Coherent (Spanish-speaking; needs interpreter)  Speech Volume:Normal  Handedness:Right   Mood and Affect  Mood:Anxious; Depressed  Affect:Constricted; Congruent   Thought Process  Thought Processes:Coherent; Other (comment) (Generally  coherent; occasionally makes illogical statements)  Descriptions of Associations:Intact (Generally intact; infrequent illogical or loose statements)  Orientation:Full (Time, Place and Person)  Thought Content:Paranoid Ideation; Rumination; Other (comment) (Some somatic focus; generally logical but occasional illogical statements)  History of Schizophrenia/Schizoaffective disorder:No  Duration of Psychotic Symptoms:No data recorded Hallucinations:Hallucinations: Auditory; Visual Description of Auditory Hallucinations: hearing music he described as coming from his stomach Description of Visual Hallucinations: sees objects but does not describe them  Ideas of Reference:Paranoia  Suicidal Thoughts:Suicidal Thoughts: No (Denies SI today; had thoughts about jumping into a lake prior to admission)  Homicidal Thoughts:Homicidal Thoughts: No   Sensorium  Memory:Immediate Fair; Recent Fair; Remote [place]  Insight:Fair   Executive Functions  Concentration:Fair  Attention Span:Fair  [place] of Knowledge:Good  Language:Good (Primarily Spanish-speaking; needs Spanish-speaking interpreter)   Psychomotor Activity  Psychomotor Activity:Psychomotor Activity: Normal   Assets  Assets:Desire for Improvement; Financial Resources/Insurance; Housing; Physical Health; Resilience; Social Support   Sleep  Sleep:Sleep: Poor Number of Hours of Sleep: 3.75    Physical Exam: Physical Exam Vitals and nursing note reviewed.  HENT:     Head: Normocephalic and atraumatic.  Pulmonary:     Effort: Pulmonary effort is normal.  Neurological:     General: No focal deficit present.     Mental Status: He is alert and oriented to person, place, and time.    ROS Blood pressure 121/77, pulse 88, temperature 97.8 F (36.6 C), temperature source Oral, resp. rate 16, height 5' 5"  (1.651 m), weight 60.3 kg, SpO2 100 %. Body mass index is 22.13 kg/m.   Treatment Plan  Summary: Daily contact with patient to assess and evaluate symptoms and progress in treatment and Medication management   Continue every 15-minute observation status  Psychosis -Discontinue quetiapine -Start risperidone 1 mg QHS.  Anticipate probable upward titration of risperidone as tolerated and indicated.  Depression -Continue mirtazapine 15 mg QHS for now  -Patient has had trials of Wellbutrin (which caused increased anxiety) as well as trials of SSRIs in the past that were not effective.  Insomnia -Discontinue standing dose trazodone -Continue trazodone 50 mg at bedtime PRN insomnia  Anxiety -Continue hydroxyzine 25 mg 3 times daily PRN anxiety  Disposition planning in progress.  I certify that inpatient services furnished can reasonably be expected to improve the patient's condition.  [name], MD 08/12/2020, 4:12 PM

## 2020-08-12 NOTE — BHH Group Notes (Signed)
Occupational Therapy Group Note Date: 08/12/2020 Group Topic/Focus: Stress Management  Group Description: Group encouraged increased participation and engagement through discussion focused on topic of stress management. Patients engaged interactively to discuss components of stress including physical signs, emotional signs, negative management strategies, and positive management strategies. Each individual identified one new stress management strategy they would like to try moving forward.    Therapeutic Goals: Identify current stressors Identify healthy vs unhealthy stress management strategies/techniques Discuss and identify physical and emotional signs of stress Participation Level: Patient did not attend OT group session despite personal invitation.    Plan: Continue to engage patient in OT groups 2 - 3x/week.  08/12/2020  Ponciano Ort, MOT, OTR/L

## 2020-08-12 NOTE — Tx Team (Signed)
Interdisciplinary Treatment and Diagnostic Plan Update  08/12/2020 Time of Session: 9:35am Darryl Wang MRN: [id number]  Principal Diagnosis: MDD (major depressive disorder), recurrent, severe, with psychosis ([place])  Secondary Diagnoses: Principal Problem:   MDD (major depressive disorder), recurrent, severe, with psychosis ([place]) Active Problems:   Anxiety state   Current Medications:  Current Facility-Administered Medications  Medication Dose Route Frequency Provider Last Rate Last Admin  . acetaminophen (TYLENOL) tablet 650 mg  650 mg Oral Q6H PRN Ethelene Hal, NP      . alum & mag hydroxide-simeth (MAALOX/MYLANTA) 200-200-20 MG/5ML suspension 30 mL  30 mL Oral Q4H PRN Ethelene Hal, NP      . feeding supplement (ENSURE ENLIVE / ENSURE PLUS) liquid 237 mL  237 mL Oral BID BM [name], [name], MD   237 mL at 08/11/20 1711  . hydrOXYzine (ATARAX/VISTARIL) tablet 25 mg  25 mg Oral TID PRN Ethelene Hal, NP   25 mg at 08/12/20 0820  . magnesium hydroxide (MILK OF MAGNESIA) suspension 30 mL  30 mL Oral Daily PRN Ethelene Hal, NP      . mirtazapine (REMERON) tablet 15 mg  15 mg Oral QHS Ethelene Hal, NP   15 mg at 08/11/20 2118  . OLANZapine zydis (ZYPREXA) disintegrating tablet 10 mg  10 mg Oral Q8H PRN Ethelene Hal, NP   10 mg at 08/11/20 1206   And  . ziprasidone (GEODON) injection 20 mg  20 mg Intramuscular PRN Ethelene Hal, NP      . QUEtiapine (SEROQUEL) tablet 50 mg  50 mg Oral QHS Ethelene Hal, NP   50 mg at 08/11/20 2118  . traZODone (DESYREL) tablet 50 mg  50 mg Oral QHS,MR X 1 Ethelene Hal, NP   50 mg at 08/11/20 2225   PTA Medications: Medications Prior to Admission  Medication Sig Dispense Refill Last Dose  . buPROPion (WELLBUTRIN XL) 150 MG 24 hr tablet Take 150 mg by mouth every morning.     . hydrOXYzine (ATARAX/VISTARIL) 25 MG tablet Take 1 tablet (25 mg total) by mouth every 6 (six) hours as  needed for anxiety or vomiting. (Patient taking differently: Take 25 mg by mouth 3 (three) times daily as needed for anxiety.) 30 tablet 0   . traZODone (DESYREL) 50 MG tablet Take 1 tablet (50 mg total) by mouth at bedtime and may repeat dose one time if needed. 15 tablet 0     Patient Stressors: Other: mental illness  Patient Strengths: Average or above average intelligence Capable of independent living Communication skills Physical Health Supportive family/friends Work skills  Treatment Modalities: Medication Management, Group therapy, Case management,  1 to 1 session with clinician, Psychoeducation, Recreational therapy.   Physician Treatment Plan for Primary Diagnosis: MDD (major depressive disorder), recurrent, severe, with psychosis ([place]) Long Term Goal(s): Improvement in symptoms so as ready for discharge Improvement in symptoms so as ready for discharge   Short Term Goals: Ability to identify changes in lifestyle to reduce recurrence of condition will improve Ability to verbalize feelings will improve Ability to disclose and discuss suicidal ideas Ability to identify and develop effective coping behaviors will improve Compliance with prescribed medications will improve Ability to identify triggers associated with substance abuse/mental health issues will improve Ability to identify changes in lifestyle to reduce recurrence of condition will improve Ability to verbalize feelings will improve Ability to disclose and discuss suicidal ideas Ability to identify and develop effective coping behaviors will improve  Compliance with prescribed medications will improve Ability to identify triggers associated with substance abuse/mental health issues will improve  Medication Management: Evaluate patient's response, side effects, and tolerance of medication regimen.  Therapeutic Interventions: 1 to 1 sessions, Unit Group sessions and Medication administration.  Evaluation of  Outcomes: Not Met  Physician Treatment Plan for Secondary Diagnosis: Principal Problem:   MDD (major depressive disorder), recurrent, severe, with psychosis ([place]) Active Problems:   Anxiety state  Long Term Goal(s): Improvement in symptoms so as ready for discharge Improvement in symptoms so as ready for discharge   Short Term Goals: Ability to identify changes in lifestyle to reduce recurrence of condition will improve Ability to verbalize feelings will improve Ability to disclose and discuss suicidal ideas Ability to identify and develop effective coping behaviors will improve Compliance with prescribed medications will improve Ability to identify triggers associated with substance abuse/mental health issues will improve Ability to identify changes in lifestyle to reduce recurrence of condition will improve Ability to verbalize feelings will improve Ability to disclose and discuss suicidal ideas Ability to identify and develop effective coping behaviors will improve Compliance with prescribed medications will improve Ability to identify triggers associated with substance abuse/mental health issues will improve     Medication Management: Evaluate patient's response, side effects, and tolerance of medication regimen.  Therapeutic Interventions: 1 to 1 sessions, Unit Group sessions and Medication administration.  Evaluation of Outcomes: Not Met   RN Treatment Plan for Primary Diagnosis: MDD (major depressive disorder), recurrent, severe, with psychosis ([place]) Long Term Goal(s): Knowledge of disease and therapeutic regimen to maintain health will improve  Short Term Goals: Ability to remain free from injury will improve, Ability to verbalize frustration and anger appropriately will improve, Ability to identify and develop effective coping behaviors will improve and Compliance with prescribed medications will improve  Medication Management: RN will administer medications as ordered by  provider, will assess and evaluate patient's response and provide education to patient for prescribed medication. RN will report any adverse and/or side effects to prescribing provider.  Therapeutic Interventions: 1 on 1 counseling sessions, Psychoeducation, Medication administration, Evaluate responses to treatment, Monitor vital signs and CBGs as ordered, Perform/monitor CIWA, COWS, AIMS and Fall Risk screenings as ordered, Perform wound care treatments as ordered.  Evaluation of Outcomes: Not Met   LCSW Treatment Plan for Primary Diagnosis: MDD (major depressive disorder), recurrent, severe, with psychosis ([place]) Long Term Goal(s): Safe transition to appropriate next level of care at discharge, Engage patient in therapeutic group addressing interpersonal concerns.  Short Term Goals: Engage patient in aftercare planning with referrals and resources, Increase social support, Increase ability to appropriately verbalize feelings, Increase emotional regulation, Identify triggers associated with mental health/substance abuse issues and Increase skills for wellness and recovery  Therapeutic Interventions: Assess for all discharge needs, 1 to 1 time with Social worker, Explore available resources and support systems, Assess for adequacy in community support network, Educate family and significant other(s) on suicide prevention, Complete Psychosocial Assessment, Interpersonal group therapy.  Evaluation of Outcomes: Not Met   Progress in Treatment: Attending groups: No. Participating in groups: No. Taking medication as prescribed: Yes. Toleration medication: Yes. Family/Significant other contact made: Yes, individual(s) contacted:  wife Patient understands diagnosis: No. Discussing patient identified problems/goals with staff: Yes. Medical problems stabilized or resolved: Yes. Denies suicidal/homicidal ideation: Yes. Issues/concerns per patient self-inventory: No.   New problem(s) identified:  No, Describe:  none  New Short Term/Long Term Goal(s): medication stabilization, elimination of SI thoughts, development of  comprehensive mental wellness plan.    Patient Goals:  Did not attend  Discharge Plan or Barriers: Is scheduled to receive medication management through [facility] and has an established therapist he will return to at discharge.  Reason for Continuation of Hospitalization: Delusions  Hallucinations Medication stabilization  Estimated Length of Stay: 3-5 days  Attendees: Patient: Did not attend 08/12/2020 10:20 AM  Physician:  08/12/2020 10:20 AM  Nursing:  08/12/2020 10:20 AM  RN Care Manager: 08/12/2020 10:20 AM  Social Worker: Darletta Moll, LCSW 08/12/2020 10:20 AM  Recreational Therapist:  08/12/2020 10:20 AM  Other:  08/12/2020 10:20 AM  Other:  08/12/2020 10:20 AM  Other: 08/12/2020 10:20 AM    Scribe for Treatment Team: Vassie Moselle, LCSW 08/12/2020 10:20 AM

## 2020-08-12 NOTE — BHH Counselor (Signed)
CSW spoke with Mr. Darryl Wang mother and daughter in the lobby.  The family was requesting a note showing that Mr. Darryl Wang is in the hospital for his FMLA paperwork.  The family states that this paperwork was filled out by his primary care physician.  The family was also asking for a discharge date that the CSW informed the family that there is currently no discharge date available for Mr. Darryl Wang.  CSW completed a note stating that Mr. Darryl Wang is in the hospital but that there is no current discharge date.  This note only specified [facility] and not behavioral health.  CSW will provide the family with an update once there is a decided discharge date.

## 2020-08-12 NOTE — Progress Notes (Signed)
   08/12/20 0619  Vital Signs  Pulse Rate 88  Resp 16  BP 121/77  BP Location Right Arm  BP Method Automatic  Patient Position (if appropriate) Standing  Sleep  Number of Hours 3.75   D: Pt. Denies SI/HI. Pt. Admits to hearing some voices "un poquito" Pt.denies both anxiety and depression. Pt. Was isolative on the unit but was on the phone and had a CT scan at [facility].  A:  Patient took scheduled medicine.  Support and encouragement provided Routine safety checks conducted every 15 minutes. Patient  Informed to notify staff with any concerns.   R: Safety maintained.

## 2020-08-12 NOTE — Progress Notes (Addendum)
Recreation Therapy Notes  Date: 5.16.22 Time: 0930 Location: 300 [address] Dayroom  Group Topic: Stress Management   Goal Area(s) Addresses:  Patient will actively participate in stress management techniques presented during session.  Patient will successfully identify benefit of practicing stress management post d/c.   Intervention: Guided exercise with ambient sound and script  Activity :Guided Imagery  LRT read a script that dealt with patients visualizing their peaceful place.  This is a place where can have peace, relax and escape from the things they are dealing for a few moments.  Education:  Stress Management, Discharge Planning.   Education Outcome: Acknowledges education  Clinical Observations/Feedback: Patient did not attend group session.     Victorino Sparrow, LRT/CTRS         Victorino Sparrow A 08/12/2020 10:45 AM

## 2020-08-13 LAB — LIPID PANEL
Cholesterol: 219 mg/dL — ABNORMAL HIGH (ref 0–200)
HDL: 72 mg/dL (ref >40)
LDL Cholesterol: 120 mg/dL — ABNORMAL HIGH (ref 0–99)
Total CHOL/HDL Ratio: 3 RATIO
Triglycerides: 133 mg/dL (ref <150)
VLDL: 27 mg/dL (ref 0–40)

## 2020-08-13 LAB — COMPREHENSIVE METABOLIC PANEL
ALT: 25 U/L (ref 0–44)
AST: 22 U/L (ref 15–41)
Albumin: 4.2 g/dL (ref 3.5–5.0)
Alkaline Phosphatase: 54 U/L (ref 38–126)
Anion gap: 9 (ref 5–15)
BUN: 15 mg/dL (ref 6–20)
CO2: 27 mmol/L (ref 22–32)
Calcium: 9.3 mg/dL (ref 8.9–10.3)
Chloride: 100 mmol/L (ref 98–111)
Creatinine, Ser: 0.68 mg/dL (ref 0.61–1.24)
GFR, Estimated: >60 mL/min (ref >60)
Glucose, Bld: 96 mg/dL (ref 70–99)
Potassium: 4 mmol/L (ref 3.5–5.1)
Sodium: 136 mmol/L (ref 135–145)
Total Bilirubin: 0.6 mg/dL (ref 0.3–1.2)
Total Protein: 7 g/dL (ref 6.5–8.1)

## 2020-08-13 LAB — ANA: Anti Nuclear Antibody (ANA): NEGATIVE

## 2020-08-13 LAB — CERULOPLASMIN: Ceruloplasmin: 16.6 mg/dL (ref 16.0–31.0)

## 2020-08-13 MED ORDER — OLANZAPINE 10 MG PO TABS
10.0000 mg | ORAL_TABLET | Freq: Every day | ORAL | Status: DC
  Administered 2020-08-13: 10 mg via ORAL
  Filled 2020-08-13 (×2): qty 1

## 2020-08-13 MED ORDER — ESCITALOPRAM OXALATE 5 MG PO TABS
5.0000 mg | ORAL_TABLET | Freq: Every day | ORAL | Status: DC
  Administered 2020-08-13: 5 mg via ORAL
  Filled 2020-08-13 (×4): qty 1

## 2020-08-13 MED ORDER — LORAZEPAM 0.5 MG PO TABS
0.5000 mg | ORAL_TABLET | Freq: Once | ORAL | Status: AC
  Administered 2020-08-13: 0.5 mg via ORAL
  Filled 2020-08-13: qty 1

## 2020-08-13 MED ORDER — OLANZAPINE 5 MG PO TBDP: 5.0000 mg | ORAL_TABLET | Freq: Three times a day (TID) | ORAL | Status: DC | PRN

## 2020-08-13 MED ORDER — MELATONIN 3 MG PO TABS
3.0000 mg | ORAL_TABLET | Freq: Every day | ORAL | Status: DC
  Administered 2020-08-13: 3 mg via ORAL
  Filled 2020-08-13 (×4): qty 1

## 2020-08-13 NOTE — Progress Notes (Signed)
Pt states he is having difficulty urinated-last episode was 08/12/20 at night.

## 2020-08-13 NOTE — Progress Notes (Signed)
Pt observed during shift change, pt was calm and pleasant, asked the writer to help him call his wife of whom he talk to calmly. Pt then took his night medication before bed, but a few minutes later, pt was observed pacing on the hall way and the room talking to himself. Pt stated he was praying but some one spoke evil to him, this made pt scared. Pt was medicated with Zyprexa 10 mg PO, this calmed pt down for an hour then was back to pacing and talking to self. Pt became increasingly irritable, pulling his hair and scratching himself like he was itchy. This required pt to be medicated with Geodon 20 mg IM which pt took willingly. Pt slowed down a bit then was back to pacing and talking to self. Only that this time pt started complaining of not being able to peer, although the writer witnessed pt peer before going to bed. Provider on call was notified and was able to talk with pt in depth. Bladder scan suggested but could not find the machine to do that, provider instructed to monitor and let the am staff perform bladder scan. Hat placed in the patient toilet to keep track of pt urination pattern. Pt reported understanding. Pt offered fluid then went to bed. Pt finally fell asleep at around 2 am and stayed a sleep. Will continue to monitor.

## 2020-08-13 NOTE — Progress Notes (Signed)
Darryl Wang [facility] MD Progress Note  08/13/2020 4:39 PM Darryl Wang  MRN:  [id number]  Reason for admission:  Darryl Wang de 55-year-old Spanish-speaking male with a history of anxiety and depression admitted for worsening symptoms of depression, increased anxiety, paranoia, hallucinations, responding to internal stimuli, not sleeping, not eating, appearing out of touch with reality and suicidal ideation with a plan to jump in a lake.  Objective: Medical record reviewed. Patient's case discussed in detail with members of the treatment team. MD and medical student met with and evaluated the patient this morning with the help of a native Spanish-speaking medical student for interpretation. He was pacing the halls prior to the interview. Tennis states that he's concerned about not being able to urinate and that he has fear and anxiety about this causing him to try repeatedly to urinate. He states he had an issue with his prostate many years ago (no record of this in chart or available Care Everywhere records; prostate noted to be "smooth and nontender" in note from [place] visit in 2013; wife previously confirmed "prostate issues" for Darryl Wang on 5/14). When he had this prostate issue, he had trouble emptying his bladder, but this feels different. [name] endorses frequency and urgency--feels that he needs to urinate frequently but rarely gets much out. When this happens, he drinks a lot of water and then is able to urinate later. Sleep was poor last night (0.5hrs) due to [name] waking up feeling like he had to urinate, being unable to, and pacing his room due to fear and anxiety regarding this. Per chart review, patient denies dysuria, hesitancy, or weak stream. Most recent labs and vitals reviewed and are non-concerning for UTI at this time (WBC 10.3, unremarkable CMP, afebrile), but will consider UA for ruling out UTI if symptoms persist with changes to medications and improved sleep/wake cycle. Will also  consider bladder scan if fixation persists to assess for emptying. He reports his mood today as anxious and preoccupied, frustrated that he's having difficulty urinating. He denies paranoia but sees "things in [his] head"--people, objects, "ugly things". He hears voices that tell him to "change, change, change, do something different" and talk to him about sicknesses that he could get. He is occasionally able to "come back to reality" but sometimes is unable to tell what is real. This has been occurring for a few months and is different than previous episodes of depression. He states that prior to this admission he was arguing a lot with his wife, had a hard time completing his work, and couldn't carry as much workload despite usually being a "very responsible" person. Patient looks very tired throughout the exam and was starting to fall asleep at the end, acknowledged that he is very tired because he did not sleep well last night.  Nursing staff document only 30 total minutes of sleep last night. Vitals this morning are BP of 148/102 (150/88 overnight), pulse 97, RR 20, temp 97.23F.  No new labs today.  [name] has attended some groups but not others. He was noted to be pacing the halls and talking to himself overnight even after Zyprexa 43m PO PRN and Geodon 242mIM PRN. Added 0.79m39mtivan this morning to ease anxiety, ordered for again prior to bedtime to help him get rest tonight.  Head CT performed 08/12/2020 with no reading yet available.   Principal Problem: MDD (major depressive disorder), recurrent, severe, with psychosis ([place]agnosis: Principal Problem:   MDD (major depressive disorder), recurrent, severe,  with psychosis ([place]) Active Problems:   Anxiety state  Total Time spent with patient: 30 minutes  Past Psychiatric History: See admission H&P  Past Medical History:  Past Medical History:  Diagnosis Date  . Depression    previously treated with zoloft (not helpful) and prozac (made him  anxious). Was under care of male therapist, about 7 yrs ago.   History reviewed. No pertinent surgical history.  - "Prostate issues" confirmed by wife on 08/10/20  Family History:  Family History  Problem Relation Age of Onset  . Arthritis Mother   . Depression Mother   . Alcohol abuse Father   . Diabetes Father   . Kidney disease Brother   . Depression Maternal Grandmother    Family Psychiatric  History: See admission H&P Social History:  Social History   Substance and Sexual Activity  Alcohol Use No     Social History   Substance and Sexual Activity  Drug Use No    Social History   Socioeconomic History  . Marital status: Married    Spouse name: Not on file  . Number of children: Not on file  . Years of education: Not on file  . Highest education level: Not on file  Occupational History  . Not on file  Tobacco Use  . Smoking status: Never Smoker  . Smokeless tobacco: Never Used  Substance and Sexual Activity  . Alcohol use: No  . Drug use: No  . Sexual activity: Not on file  Other Topics Concern  . Not on file  Social History Narrative  . Not on file   Social Determinants of Health   Financial Resource Strain: Not on file  Food Insecurity: Not on file  Transportation Needs: Not on file  Physical Activity: Not on file  Stress: Not on file  Social Connections: Not on file   Additional Social History:    Pain Medications: See MAR Prescriptions: See MAR Over the Counter: See MAR History of alcohol / drug use?: No history of alcohol / drug abuse (Pt denies.)                    Sleep: Poor due to constant anxiety about urination  Appetite:  Fair  Current Medications: Current Facility-Administered Medications  Medication Dose Route Frequency Provider Last Rate Last Admin  . acetaminophen (TYLENOL) tablet 650 mg  650 mg Oral Q6H PRN Ethelene Hal, NP      . alum & mag hydroxide-simeth (MAALOX/MYLANTA) 200-200-20 MG/5ML suspension 30  mL  30 mL Oral Q4H PRN Ethelene Hal, NP      . escitalopram (LEXAPRO) tablet 5 mg  5 mg Oral Daily [name], MD   5 mg at 08/13/20 1127  . feeding supplement (ENSURE ENLIVE / ENSURE PLUS) liquid 237 mL  237 mL Oral BID BM [name], [name], MD   237 mL at 08/13/20 1029  . hydrOXYzine (ATARAX/VISTARIL) tablet 25 mg  25 mg Oral TID PRN Ethelene Hal, NP   25 mg at 08/12/20 2045  . LORazepam (ATIVAN) tablet 0.5 mg  0.5 mg Oral Once [name], MD      . magnesium hydroxide (MILK OF MAGNESIA) suspension 30 mL  30 mL Oral Daily PRN Ethelene Hal, NP      . melatonin tablet 3 mg  3 mg Oral QHS [name], MD      . OLANZapine [facility]) tablet 10 mg  10 mg Oral QHS Ethelene Browns  L, MD      . OLANZapine zydis (ZYPREXA) disintegrating tablet 5 mg  5 mg Oral Q8H PRN [name], MD      . traZODone (DESYREL) tablet 50 mg  50 mg Oral QHS PRN [name], MD   50 mg at 08/12/20 2045    Lab Results:  Results for orders placed or performed during the hospital encounter of 08/10/20 (from the past 48 hour(s))  HIV Antibody (routine testing w rflx)     Status: None   Collection Time: 08/12/20  6:28 AM  Result Value Ref Range   HIV Screen 4th Generation wRfx Non Reactive Non Reactive    Comment: Performed at [facility], [address], [place], [place] [address]  RPR     Status: None   Collection Time: 08/12/20  6:28 AM  Result Value Ref Range   RPR Ser Ql NON REACTIVE NON REACTIVE    Comment: Performed at [facility], [place] [address], [place], [place] [address]  Sedimentation rate     Status: None   Collection Time: 08/12/20  6:28 AM  Result Value Ref Range   Sed Rate 1 0 - 16 mm/hr    Comment: Performed at [facility], [place] [address], [place], [place] [address]  Vitamin B12     Status: None   Collection Time: 08/12/20  6:28 AM  Result Value Ref Range   Vitamin B-12 201 180 - 914 pg/mL    Comment: (NOTE) This assay is  not validated for testing neonatal or myeloproliferative syndrome specimens for Vitamin B12 levels. Performed at [facility] [place], [place] [address], [place], [place] [address]   Ceruloplasmin     Status: None   Collection Time: 08/12/20  6:28 AM  Result Value Ref Range   Ceruloplasmin 16.6 16.0 - 31.0 mg/dL    Comment: (NOTE) Performed At: [facility] [place], [place] [id number] Rush Farmer MD [id number]   ANA     Status: None   Collection Time: 08/12/20  6:28 AM  Result Value Ref Range   Anti Nuclear Antibody (ANA) Negative Negative    Comment: (NOTE) Performed At: [facility] [place], [place] [id number] Rush Farmer MD [id number]   Lipid panel     Status: Abnormal   Collection Time: 08/12/20  6:28 AM  Result Value Ref Range   Cholesterol 219 (H) 0 - 200 mg/dL   Triglycerides 133 <150 mg/dL   HDL 72 >40 mg/dL   Total CHOL/HDL Ratio 3.0 RATIO   VLDL 27 0 - 40 mg/dL   LDL Cholesterol 120 (H) 0 - 99 mg/dL    Comment:        Total Cholesterol/HDL:CHD Risk Coronary Heart Disease Risk Table                     Men   Women  1/2 Average Risk   3.4   3.3  Average Risk       5.0   4.4  2 X Average Risk   9.6   7.1  3 X Average Risk  23.4   11.0        Use the calculated Patient Ratio above and the CHD Risk Table to determine the patient's CHD Risk.        ATP III CLASSIFICATION (LDL):  <100     mg/dL   Optimal  100-129  mg/dL   Near or  Above                    Optimal  130-159  mg/dL   Borderline  160-189  mg/dL   High  >190     mg/dL   Very High Performed at [place] [address], [place], [place] [address]   CBC with Differential/Platelet     Status: None   Collection Time: 08/12/20  6:28 AM  Result Value Ref Range   WBC 10.3 4.0 - 10.5 K/uL   RBC 5.06 4.22 - 5.81 MIL/uL   Hemoglobin 14.6 13.0 - 17.0 g/dL   HCT 44.1 39.0 - 52.0 %   MCV 87.2 80.0 - 100.0 fL   MCH  28.9 26.0 - 34.0 pg   MCHC 33.1 30.0 - 36.0 g/dL   RDW 13.1 11.5 - 15.5 %   Platelets 266 150 - 400 K/uL   nRBC 0.0 0.0 - 0.2 %   Neutrophils Relative % 55 %   Neutro Abs 5.6 1.7 - 7.7 K/uL   Lymphocytes Relative 36 %   Lymphs Abs 3.8 0.7 - 4.0 K/uL   Monocytes Relative 9 %   Monocytes Absolute 0.9 0.1 - 1.0 K/uL   Eosinophils Relative 0 %   Eosinophils Absolute 0.0 0.0 - 0.5 K/uL   Basophils Relative 0 %   Basophils Absolute 0.0 0.0 - 0.1 K/uL   Immature Granulocytes 0 %   Abs Immature Granulocytes 0.02 0.00 - 0.07 K/uL    Comment: Performed at [facility], [place] [address] [place], [place] [address]  Comprehensive metabolic panel     Status: None   Collection Time: 08/12/20  6:28 AM  Result Value Ref Range   Sodium 136 135 - 145 mmol/L   Potassium 4.0 3.5 - 5.1 mmol/L   Chloride 100 98 - 111 mmol/L   CO2 27 22 - 32 mmol/L   Glucose, Bld 96 70 - 99 mg/dL    Comment: Glucose reference range applies only to samples taken after fasting for at least 8 hours.   BUN 15 6 - 20 mg/dL   Creatinine, Ser 0.68 0.61 - 1.24 mg/dL   Calcium 9.3 8.9 - 10.3 mg/dL   Total Protein 7.0 6.5 - 8.1 g/dL   Albumin 4.2 3.5 - 5.0 g/dL   AST 22 15 - 41 U/L   ALT 25 0 - 44 U/L   Alkaline Phosphatase 54 38 - 126 U/L   Total Bilirubin 0.6 0.3 - 1.2 mg/dL   GFR, Estimated >60 >60 mL/min    Comment: (NOTE) Calculated using the CKD-EPI Creatinine Equation (2021)    Anion gap 9 5 - 15    Comment: Performed at [facility], [place] [address] [place], [place] [address]    Blood Alcohol level:  Lab Results  Component Value Date   ETH <10 67/67/2094    Metabolic Disorder Labs: Lab Results  Component Value Date   HGBA1C 6.2 (H) 08/08/2020   MPG 131.24 08/08/2020   Lab Results  Component Value Date   PROLACTIN 6.5 08/08/2020   Lab Results  Component Value Date   CHOL 219 (H) 08/12/2020   TRIG 133 08/12/2020   HDL 72 08/12/2020   CHOLHDL 3.0 08/12/2020    VLDL 27 08/12/2020   LDLCALC 120 (H) 08/12/2020   LDLCALC 129 (H) 08/08/2020    Physical Findings: AIMS: Facial and Oral Movements Muscles of Facial Expression: None, normal Lips and Perioral Area: None, normal  Jaw: None, normal Tongue: None, normal,Extremity Movements Upper (arms, wrists, hands, fingers): None, normal Lower (legs, knees, ankles, toes): None, normal, Trunk Movements Neck, shoulders, hips: None, normal, Overall Severity Severity of abnormal movements (highest score from questions above): None, normal Incapacitation due to abnormal movements: None, normal Patient's awareness of abnormal movements (rate only patient's report): No Awareness, Dental Status Current problems with teeth and/or dentures?: No Does patient usually wear dentures?: No  CIWA:    COWS:      Psychiatric Specialty Exam:  Presentation  General Appearance: Appropriate for Environment; Fairly Groomed  Eye Contact:Fair (Falling asleep towards the end of the interview)  Speech:Normal Rate  Speech Volume:Normal  Handedness:Right   Mood and Affect  Mood:Anxious; Depressed  Affect:Constricted; Congruent   Thought Process  Thought Processes:-- (Mostly coherent, sometimes difficult to follow, very fixated on urination)  Descriptions of Associations:Tangential  Orientation:Full (Time, Place and Person)  Thought Content:Rumination; Perseveration  History of Schizophrenia/Schizoaffective disorder:No  Duration of Psychotic Symptoms:Less than six months  Hallucinations:Hallucinations: Auditory; Visual Description of Auditory Hallucinations: "change, change, change, do something different", talking to him about sicknesses he could get Description of Visual Hallucinations: People who are there and then gone, objects, "ugly things"  Ideas of Reference:None  Suicidal Thoughts:Suicidal Thoughts: No SI Passive Intent and/or Plan: Without Intent; Without Plan  Homicidal Thoughts:Homicidal  Thoughts: No   Sensorium  Memory:Immediate Good; Recent Good  Judgment:Fair  Insight:Fair (Acknowledges that he's having difficulty telling reality from hallucinations)   Executive Functions  Concentration:Poor  Attention Span:Fair  [place]  Language:Good   Psychomotor Activity  Psychomotor Activity:Psychomotor Activity: Normal   Assets  Assets:Desire for Improvement; Social Support; Housing; Intimacy   Sleep  Sleep:Sleep: Poor Number of Hours of Sleep: 0.5    Physical Exam: Physical Exam Constitutional:      Appearance: Normal appearance.  HENT:     Head: Normocephalic.  Pulmonary:     Effort: Pulmonary effort is normal.  Musculoskeletal:     Cervical back: Normal range of motion.  Neurological:     Mental Status: He is alert and oriented to person, place, and time.    Review of Systems  Constitutional: Negative for fever.  Skin: Positive for itching.       Itching during agitation episode last night  Psychiatric/Behavioral: Positive for hallucinations. The patient is nervous/anxious and has insomnia.   All other systems reviewed and are negative.  Blood pressure (!) 148/102, pulse 97, temperature 97.7 F (36.5 C), temperature source Oral, resp. rate 20, height 5' 5"  (1.651 m), weight 60.3 kg, SpO2 100 %. Body mass index is 22.13 kg/m.   Treatment Plan Summary: Daily contact with patient to assess and evaluate symptoms and progress in treatment and Medication management   Continue every 15-minute observation status  Psychosis -Discontinue Risperidone -Start Olanzapine 568m qhs standing dose.  Consider further upward titration as indicated and tolerated.  Depression -Discontinue Mirtazepine -Start Escitalopram 547mdaily.  Anticipate further upward titration as tolerated. -Patient has had trials of Wellbutrin (which caused increased anxiety) as well as trials of other SSRIs in the past that were not effective but  duration and dose not known.  Insomnia -Start melatonin 3 mg at bedtime -Continue trazodone 50 mg at bedtime PRN insomnia -Give one-time dose of lorazepam 0.5 mg at bedtime tonight to reduce nighttime anxiety and promote sleep  Anxiety -Continue hydroxyzine 25 mg 3 times daily PRN anxiety -Lorazepam 0.68m72mnce tonight before bedtime for to reduce anxiety at night  and promote sleep  Urinary symptoms (urgency, frequency) -Likely 2/2 anxiety but continue to monitor for possible UTI  -Consider UA and/or bladder US if symptoms persist with improved anxiety and sleep/wake cycles   Disposition planning in progress.   Clydene Pugh, Medical Student Ethelene Browns, MD  I attest that I saw and interviewed the patient and that I performed or reperformed the mental status examination of the patient.  I reviewed the medical record.  I have formulated the plan of treatment.  I am in agreement with the exam, assessment and plan as documented above.  [name], MD 08/13/2020, 4:39 PM

## 2020-08-13 NOTE — Progress Notes (Signed)
Pt did not attend group. 

## 2020-08-13 NOTE — Progress Notes (Addendum)
Notified by nursing staff at 12:34 AM that patient was appearing to be very anxious and pacing the [address] of the adult unit.  Patient assessed by myself at the patient's bedside along with the assistance of 2 virtual Spanish-speaking interpreters Darryl Wang, Darryl Wang, Darryl Wang, Darryl Wang). RN present during assessment as well.  Patient states that he has been feeling very anxious due to worrying about various subjects.  Patient states that on 08/12/2020, he began to experience difficulty urinating/inability to urinate and he states that he was unable to urinate on multiple occasions during the day on 08/12/2020.  He reports that on these occasions, he did not experience any dysuria, weak stream, hesitancy, or any additional GU symptoms and he states that he simply was just not able to urinate multiple times on 08/12/2020.  Patient does report that he was able to urinate fine about 5 minutes prior to me speaking with him.  He reports that when he urinated at that time, he did not experience any dysuria or any additional GU symptoms.  He denies any hematuria.  Nursing also reports that around 2215 on 08/12/20, patient produced a large quantity of urine.  Patient reports that he believes his difficulty urinating may be related to his anxiety.  He states that he is stressed about not being able to urinate in the future.  Patient denies any chest pain, shortness of breath, lightheadedness, dizziness, headache, nausea, vomiting, abdominal pain, or any additional physical symptoms at this time.  Patient denies SI, HI, or AVH on exam.  He does endorse experiencing AVH during the evening of 08/12/2020 in which she states that he "saw and heard things moving around my head" but does not provide further details regarding this.  Vitals obtained on the patient: BP 150/88 mmHg, pulse 103 bpm, SPO2 99%.  1 cup of ginger ale provided to patient and patient encouraged to drink fluids in order to help him urinate.Attempted to  obtain bladder scan on the patient with hopes of providing reassurance about bladder emptying.  However, myself and nursing staff were unable to locate a bladder scan machine.  Urine collection hat placed in patient's toilet.  Patient instructed to urinate in the hat so nursing can monitor his urination patterns.  Patient verbalizes understanding and agreement of this plan. Per Mordecai Maes, NP 08/10/20 [facility] H&P note: "As per wife, patient Had prostate issues years ago although recently he became hyper focused on it." Recommend that day shift treatment team attempt to obtain a bladder scan for the patient during the day on 08/13/2020 if possible in order to decide if further work-up is necessary based on bladder scan results and patient's urinary patterns.   Patient states that he will try to get some rest and does not have any concerns at this time.  Patient instructed to notify nursing if any additional issues arise.

## 2020-08-13 NOTE — Progress Notes (Signed)
Recreation Therapy Notes  Animal-Assisted Activity (AAA) Program Checklist/Progress Notes Patient Eligibility Criteria Checklist & Daily Group note for Rec Tx Intervention  Date: 5.17.22 Time: 1430 Location: 300 [address] Dayroom   AAA/T Program Assumption of Risk Form signed by Patient/ or Parent Legal Guardian  YES  Patient is free of allergies or severe asthma  YES  Patient reports no fear of animals  YES   Patient reports no history of cruelty to animals YES  Patient understands his/her participation is voluntary YES   Patient washes hands before animal contact YES  Patient washes hands after animal contact  YES  Education: Hand Washing, Appropriate Animal Interaction   Education Outcome: Acknowledges understanding/In group clarification offered/Needs additional education.   Clinical Observations/Feedback: Pt did not attend group activity.      Geraldean Walen, LRT/CTRS         Amar Sippel A 08/13/2020 3:37 PM 

## 2020-08-13 NOTE — Progress Notes (Addendum)
   08/13/20 0620  Sleep  Number of Hours 0.5   Per RN shift report, Pt did not sleep at all last night despite PRNs given. Pt was found pacing around room. This a.m., Pt rates anxiety 7/10; depression 6/10. Pt denies SI/HI/AVH. Pt remains anxious on approach and was pacing in/out of room and hallway. Pt was started on Lexapro and given 1X dose of Ativan. Pt remains safe.

## 2020-08-14 LAB — URINALYSIS, ROUTINE W REFLEX MICROSCOPIC
Bilirubin Urine: NEGATIVE
Glucose, UA: NEGATIVE mg/dL
Hgb urine dipstick: NEGATIVE
Ketones, ur: NEGATIVE mg/dL
Leukocytes,Ua: NEGATIVE
Nitrite: NEGATIVE
Protein, ur: NEGATIVE mg/dL
Specific Gravity, Urine: 1.006 (ref 1.005–1.030)
pH: 6 (ref 5.0–8.0)

## 2020-08-14 MED ORDER — LORAZEPAM 0.5 MG PO TABS
0.5000 mg | ORAL_TABLET | Freq: Four times a day (QID) | ORAL | Status: DC | PRN
  Administered 2020-08-15 – 2020-08-16 (×2): 0.5 mg via ORAL
  Filled 2020-08-14 (×2): qty 1

## 2020-08-14 MED ORDER — MELATONIN 5 MG PO TABS
5.0000 mg | ORAL_TABLET | Freq: Every day | ORAL | Status: DC
  Administered 2020-08-14 – 2020-08-19 (×6): 5 mg via ORAL
  Filled 2020-08-14 (×9): qty 1

## 2020-08-14 MED ORDER — ESCITALOPRAM OXALATE 10 MG PO TABS
10.0000 mg | ORAL_TABLET | Freq: Every day | ORAL | Status: DC
  Administered 2020-08-14 – 2020-08-18 (×5): 10 mg via ORAL
  Filled 2020-08-14 (×6): qty 1

## 2020-08-14 MED ORDER — MELATONIN 3 MG PO TABS
3.0000 mg | ORAL_TABLET | Freq: Every day | ORAL | Status: DC
  Filled 2020-08-14: qty 1

## 2020-08-14 MED ORDER — PROPRANOLOL HCL 10 MG PO TABS
10.0000 mg | ORAL_TABLET | Freq: Two times a day (BID) | ORAL | Status: DC
  Administered 2020-08-14 – 2020-08-15 (×2): 10 mg via ORAL
  Filled 2020-08-14 (×7): qty 1

## 2020-08-14 MED ORDER — OLANZAPINE 7.5 MG PO TABS
15.0000 mg | ORAL_TABLET | Freq: Every day | ORAL | Status: DC
  Filled 2020-08-14 (×2): qty 2

## 2020-08-14 MED ORDER — OLANZAPINE 7.5 MG PO TABS
15.0000 mg | ORAL_TABLET | Freq: Every day | ORAL | Status: DC
  Administered 2020-08-14: 15 mg via ORAL
  Filled 2020-08-14 (×3): qty 2

## 2020-08-14 NOTE — Progress Notes (Signed)
Pt has only slept 1 hour so far tonight. Pt has been occasionally pacing in his room. When assessed, pt said that he thinks that he has been receiving too much medication and that it is causing him insomnia. Pt said that he has been pacing a lot in order to make himself tired. Pt has been observed laying down every now and then for a few minutes before getting back up. He has been in the bathroom a lot. Pt appears paranoid and preoccupied. Pt said that he has been having trouble urinating. He has been observed urinating in his bathroom, but pt said that he is having a hard time getting his stream of urine started. Pt said that he has to stand there for a few minutes. Encouraged pt to drink fluids and also provided him with a hat to place in his toilet so this writer can assess how his urine looks. Urine examined at the beginning of the shift was light yellow in color and no sediment or cloudiness was noted. Informed NP, Darrol Angel who has ordered for a urinalysis to be collected.

## 2020-08-14 NOTE — Progress Notes (Signed)
Recreation Therapy Notes  Date: 5.18.22 Time: 0950 Location: [facility]  Group Topic: Stress Management  Goal Area(s) Addresses:  Patient will identify positive stress management techniques. Patient will identify benefits of using stress management post d/c.  Intervention: Stress Management  Activity: Meditation.  LRT played a meditation that focused on taking on the characteristics of a mountain.  The meditation focused on being unmovable in the face of changes/obstacles that occur in life.     Education:  Stress Management, Discharge Planning.   Education Outcome: Acknowledges Education  Clinical Observations/Feedback: Pt did not attend group session.    Victorino Sparrow, LRT/CTRS        Victorino Sparrow A 08/14/2020 10:55 AM

## 2020-08-14 NOTE — Progress Notes (Signed)
Pt speaks mostly in [place] and [place] is limited.  RN spoke to pt who was visiting with pt's daughter in pt's room. Daughter translated for pt.  Pt denies SI.  Endorses anxiety, acknowledged that his preoccupation with his urination and cancer status is due to "my anxiety issues, that's what my family doctor told me."  Pt was calm for the most part throughout the afternoon.  RN observed pt pacing the hallways before dinner, but pt responded well to redirection and reassurance and pt went back to his room before it was time for dinner.  Pt has also been out of his room to talk to family/friends on the phone.  Pt is calm at this time.  RN will continue to monitor and provide support as needed.

## 2020-08-14 NOTE — Progress Notes (Addendum)
[facility] MD Progress Note  08/14/2020 4:56 PM Darryl Wang  MRN:  [id number]   Reason for admission:Darryl Ortizis a 55 year old Spanish-speaking male with a history of anxiety and depression admitted for worsening symptoms of depression, increased anxiety, paranoia, hallucinations, responding to internal stimuli, not sleeping, not eating, appearing out of touch with reality and suicidal ideation with a plan to jump in a lake.  Objective: Medical record reviewed. Patient's case discussed in detail with members of the treatment team. MD and medical student met with and evaluated the patient this morning with the help of a native Spanish-speaking medical student for interpretation. Prior to interview, Darryl Wang was sleeping comfortably in bed. Nursing reports indicated 1 hour of sleep last night. MAR indicates receipt of PRN hydroxyzine 1m and PRN trazodone 540mat 2330. Darryl Wang tired this morning with eyes half-closed while speaking and periodically dozing off during the interview although he can be awakened. He is oriented to year/month/day of the week but not to date--stated 5/5 as date. He states that he slept a little bit yesterday and had trouble last night due to anxiety. He is experiencing realistic dreams, likely due to a half-sleep state, during which he is talking to his wife and family but then he wakes up and they're gone, so he can't tell what's real. He describes this as family "spinning above his head". He states that he talked to his wife yesterday, this went well and she is supportive. He describes his mood as calmer but still anxious at times.  He denies wish for death or suicidal ideation.  Somatic focus of thought content persists.  Darryl Wang about his body being able to hear his thoughts or a connection between his body and thoughts.   We discussed with him the plan to discontinue hydroxyzine and schedule evening medications at an earlier time to promote better sleep at nighttime and less  morning sedation.  The patient is still worried about problems urinating overnight and this morning. UA collected overnight and WNL. Darryl Wang afebrile this morning. UTI remains unlikely.  Still waiting on official read on head CT, called Darryl Wang today; spoke to Darryl Wang said she'd call the radiology team to have them read the scan today. Vitals this morning are temp 97.36F, pulse 78, respirations 16, BP 124/99. No new labs today.   Principal Problem: MDD (major depressive disorder), recurrent, severe, with psychosis (HCFort ScottDiagnosis: Principal Problem:   MDD (major depressive disorder), recurrent, severe, with psychosis (HCGracemontActive Problems:   Anxiety state  Total Time spent with patient: 30 minutes  Past Psychiatric History: see admission H&P  Past Medical History:  Past Medical History:  Diagnosis Date  . Depression    previously treated with zoloft (not helpful) and prozac (made him anxious). Was under care of male therapist, about 7 yrs ago.   History reviewed. No pertinent surgical history. Family History:  Family History  Problem Relation Age of Onset  . Arthritis Mother   . Depression Mother   . Alcohol abuse Father   . Diabetes Father   . Kidney disease Brother   . Depression Maternal Grandmother    Family Psychiatric  History: see admission H&P Social History:  Social History   Substance and Sexual Activity  Alcohol Use No     Social History   Substance and Sexual Activity  Drug Use No    Social History   Socioeconomic History  . Marital status: Married    Spouse name: Not on  file  . Number of children: Not on file  . Years of education: Not on file  . Highest education level: Not on file  Occupational History  . Not on file  Tobacco Use  . Smoking status: Never Smoker  . Smokeless tobacco: Never Used  Substance and Sexual Activity  . Alcohol use: No  . Drug use: No  . Sexual activity: Not on file  Other Topics Concern  . Not on  file  Social History Narrative  . Not on file   Social Determinants of Health   Financial Resource Strain: Not on file  Food Insecurity: Not on file  Transportation Needs: Not on file  Physical Activity: Not on file  Stress: Not on file  Social Connections: Not on file   Additional Social History:    Pain Medications: See MAR Prescriptions: See MAR Over the Counter: See MAR History of alcohol / drug use?: No history of alcohol / drug abuse (Pt denies.)                    Sleep: Poor  Appetite:  Good  Current Medications: Current Facility-Administered Medications  Medication Dose Route Frequency Provider Last Rate Last Admin  . acetaminophen (TYLENOL) tablet 650 mg  650 mg Oral Q6H PRN Ethelene Hal, NP      . alum & mag hydroxide-simeth (MAALOX/MYLANTA) 200-200-20 MG/5ML suspension 30 mL  30 mL Oral Q4H PRN Ethelene Hal, NP      . escitalopram (LEXAPRO) tablet 10 mg  10 mg Oral Daily [name], MD   10 mg at 08/14/20 0817  . feeding supplement (ENSURE ENLIVE / ENSURE PLUS) liquid 237 mL  237 mL Oral BID BM [name], [name], MD   237 mL at 08/14/20 0946  . LORazepam (ATIVAN) tablet 0.5 mg  0.5 mg Oral Q6H PRN [name], MD      . magnesium hydroxide (MILK OF MAGNESIA) suspension 30 mL  30 mL Oral Daily PRN Ethelene Hal, NP      . melatonin tablet 3 mg  3 mg Oral QHS [name], MD      . OLANZapine [facility]) tablet 15 mg  15 mg Oral QHS [name], MD      . OLANZapine zydis (ZYPREXA) disintegrating tablet 5 mg  5 mg Oral Q8H PRN [name], MD      . propranolol (INDERAL) tablet 10 mg  10 mg Oral Q12H [name], MD      . traZODone (DESYREL) tablet 50 mg  50 mg Oral QHS PRN [name], MD   50 mg at 08/13/20 2336    Lab Results:  Results for orders placed or performed during the hospital encounter of 08/10/20 (from the past 48 hour(s))  Urinalysis, Routine w reflex microscopic Urine, Clean Catch      Status: Abnormal   Collection Time: 08/14/20  4:08 AM  Result Value Ref Range   Color, Urine STRAW (A) YELLOW   APPearance CLEAR CLEAR   Specific Gravity, Urine 1.006 1.005 - 1.030   pH 6.0 5.0 - 8.0   Glucose, UA NEGATIVE NEGATIVE mg/dL   Hgb urine dipstick NEGATIVE NEGATIVE   Bilirubin Urine NEGATIVE NEGATIVE   Ketones, ur NEGATIVE NEGATIVE mg/dL   Protein, ur NEGATIVE NEGATIVE mg/dL   Nitrite NEGATIVE NEGATIVE   Leukocytes,Ua NEGATIVE NEGATIVE    Comment: Performed at [facility], [place] [address] [place], [place] [address]  Blood Alcohol level:  Lab Results  Component Value Date   ETH <10 84/66/5993    Metabolic Disorder Labs: Lab Results  Component Value Date   HGBA1C 6.2 (H) 08/08/2020   MPG 131.24 08/08/2020   Lab Results  Component Value Date   PROLACTIN 6.5 08/08/2020   Lab Results  Component Value Date   CHOL 219 (H) 08/12/2020   TRIG 133 08/12/2020   HDL 72 08/12/2020   CHOLHDL 3.0 08/12/2020   VLDL 27 08/12/2020   LDLCALC 120 (H) 08/12/2020   LDLCALC 129 (H) 08/08/2020    Physical Findings: AIMS: Facial and Oral Movements Muscles of Facial Expression: None, normal Lips and Perioral Area: None, normal Jaw: None, normal Tongue: None, normal,Extremity Movements Upper (arms, wrists, hands, fingers): None, normal Lower (legs, knees, ankles, toes): None, normal, Trunk Movements Neck, shoulders, hips: None, normal, Overall Severity Severity of abnormal movements (highest score from questions above): None, normal Incapacitation due to abnormal movements: None, normal Patient's awareness of abnormal movements (rate only patient's report): No Awareness, Dental Status Current problems with teeth and/or dentures?: No Does patient usually wear dentures?: No  CIWA:    COWS:     Psychiatric Specialty Exam:  Presentation  General Appearance: Appropriate for Environment (Appears tired.  Intermittently dozes off in seated position during  the interview but can be awakened.)  Eye Contact:Other (comment) (Intermittently closes eyes and drifts to sleep during interview.  Eye contact is good when eyes open and awake.)  Speech:Other (comment) (Intermittently a bit slurred)  Speech Volume:Normal  Handedness:Right   Mood and Affect  Mood:Anxious (Initially reports feeling calm then states he is anxious) Still anxious at times. Affect:Congruent; Constricted   Thought Process  Thought Processes:Coherent (Superficially linear but some intermittent disorganization is present)  Descriptions of Associations:Loose (Provides nonresponsive answers at times)  Orientation:Other (comment) (Oriented to month, year, self, place but not oriented to date)  Thought Content:Logical; Rumination; Perseveration; Other (comment) (Somatic focus)  History of Schizophrenia/Schizoaffective disorder:No  Duration of Psychotic Symptoms:Less than six months  Hallucinations:Hallucinations: Auditory; Visual (Reported hallucinations sound hypnopompic or hypnagogic in nature) Description of Auditory Hallucinations: "change, change, change, do something different", talking to him about sicknesses he could get Description of Visual Hallucinations: talking to his family then they disappear, he's unsure if he's awake or asleep, what's real vs. not  Ideas of Reference:None  Suicidal Thoughts:Suicidal Thoughts: No SI Passive Intent and/or Plan: Without Intent; Without Plan  Homicidal Thoughts:Homicidal Thoughts: No   Sensorium  Memory:Immediate Good; Recent Fair  Judgment:Fair  Insight:Fair   Executive Functions  Concentration:Fair  Attention Span:Fair  [place]  Language:Good   Psychomotor Activity  Psychomotor Activity:Psychomotor Activity: Decreased (Appears fatigued)   Assets  Assets:Communication Skills; Desire for Improvement; Resilience; Social Support   Sleep  Sleep:Sleep: Poor Number of  Hours of Sleep: 1    Physical Exam: Physical Exam Constitutional:      Appearance: Normal appearance.     Comments: Intermittently dozes off during the interview but can be awakened without difficulty  HENT:     Head: Normocephalic.  Pulmonary:     Effort: Pulmonary effort is normal.  Neurological:     General: No focal deficit present.     Mental Status: He is oriented to person, place, and time.    Review of Systems  Genitourinary: Negative for dysuria, frequency and urgency.       Still endorses "difficulty urinating"  All other systems reviewed and are negative.  Blood pressure Marland Kitchen)  132/94, pulse 75, temperature 97.7 F (36.5 C), temperature source Oral, resp. rate 16, height 5' 5" (1.651 m), weight 60.3 kg, SpO2 100 %. Body mass index is 22.13 kg/m.   Treatment Plan Summary: Daily contact with patient to assess and evaluate symptoms and progress in treatment and Medication management  Continue every 15-minute observation status, no roommate  Plan to collect collateral from wife today  Psychosis -Increase Olanzapine to 53m and change dose time to Q 1900.  Consider further upward titration as indicated and tolerated. -Plan for medication administration earlier in the evening (7PM) to promote better sleep given persistent difficulty at night and AM tiredness  -Olanzapine 528mPRN for breakthrough agitation  Depression -Increase Escitalopram to 1019maily.  Anticipate further upward titration as tolerated. -Patient has had trials of Wellbutrin (which caused increased anxiety) as well as trials of other SSRIs in the past that were not effective but duration and dose not known.  Insomnia -Change melatonin to 3 mg Q 1900 -Continue trazodone 50 mg at bedtimePRNinsomnia -Moving medications earlier in the evening to promote sleep at nighttime  Anxiety -Discontinue hydroxyzine -Start propranolol 10 mg every 12 hours -Continue lorazepam 0.5mg56mh PRN  deity  Urinary symptoms ("trouble urinating", no dysuria, urgency, frequency today) -Likely 2/2 anxiety but continue to monitor for possible UTI  -UA normal -Consider tracking I/Os for 24hr for objective data regarding "trouble urinating"  Disposition planning in progress.  HeatMaxwell Mariondical Student MartEthelene Browns  I attest that I saw and interviewed the patient and that I performed or reperformed the mental status examination of the patient.  I reviewed the medical record.  I have formulated the plan of treatment.  I am in agreement with the exam, assessment and plan as documented above.  MartArthor Captain 08/14/2020, 4:56 PM

## 2020-08-14 NOTE — [facility] Group Notes (Signed)
Type of Therapy and Topic:  Group Therapy:  Setting Goals   Participation Level:  Active   Description of Group: In this process group, patients discussed using strengths to work toward goals and address challenges.  Patients identified two positive things about themselves and one goal they were working on.  Patients were given the opportunity to share openly and support each other's plan for self-empowerment.  The group discussed the value of gratitude and were encouraged to have a daily reflection of positive characteristics or circumstances.  Patients were encouraged to identify a plan to utilize their strengths to work on current challenges and goals.   Therapeutic Goals 1. Patient will verbalize personal strengths/positive qualities and relate how these can assist with achieving desired personal goals 2. Patients will verbalize affirmation of peers plans for personal change and goal setting 3. Patients will explore the value of gratitude and positive focus as related to successful achievement of goals 4. Patients will verbalize a plan for regular reinforcement of personal positive qualities and circumstances.   Summary of Patient Progress: Patient identified the definition of goals. Patients was given the opportunity to share openly and support other group members' plan for self-empowerment. Patient verbalized personal strength and how they relate to achieving the desired goal. Patient was able to identify positive goals to work towards when she returns home.  

## 2020-08-14 NOTE — Progress Notes (Signed)
Pt denies SI/HI and VH. He endorses AH of his stomach singing songs, but they are "low" tonight. He denies that they are command in nature. Pt appears less anxious and restless tonight. He spends the majority of his time in his room and is preoccupied. He occasionally walks out to the nurses stations. Pt thinks that his medications are working. He denies any urinary problems tonight. Pt said that his appetite has improved. Pt has been trying to sleep. He has been laying down in bed, but has still been fidgety. He was administered his PRN vistaril and trazodone at 2336. Active listening, reassurance, and support provided. Medications administered as ordered by provider. Q 15 min safety checks continue. Pt's safety has been maintained.   08/13/20 2111  Psych Admission Type (Psych Patients Only)  Admission Status Voluntary  Psychosocial Assessment  Patient Complaints Anxiety;Restlessness;Worrying;Sadness  Eye Contact Fair  Facial Expression Anxious;Pensive;Worried  Affect Anxious;Appropriate to circumstance;Preoccupied  Speech Logical/coherent;Soft;Slow  Interaction Cautious;Forwards little;Minimal;Guarded  Motor Activity Fidgety;Pacing;Restless  Appearance/Hygiene Unremarkable  Behavior Characteristics Anxious;Fidgety;Pacing;Cooperative  Mood Anxious;Pleasant;Preoccupied  Thought Pension scheme manager thinking  Content Paranoia;Preoccupation  Delusions Paranoid  Perception Hallucinations  Hallucination Auditory  Judgment Poor  Confusion Mild  Danger to Self  Current suicidal ideation? Denies  Danger to Others  Danger to Others None reported or observed

## 2020-08-14 NOTE — Progress Notes (Signed)
[facility] Group Notes:  (Nursing/MHT/Case Management/Adjunct)  Date:  08/14/2020  Time:  2015  Type of Therapy:  wrap up group  Participation Level:  Minimal  Participation Quality:  Attentive and Supportive  Affect:  Anxious  Cognitive:  Alert  Insight:  Improving  Engagement in Group:  Limited and Supportive  Modes of Intervention:  Clarification, Education and Support  Summary of Progress/Problems: Positive thinking and positive change were discussed. Pt shared and participated at a limited level because of limited English interpretation.   Shellia Cleverly 08/14/2020, 10:45 PM

## 2020-08-15 MED ORDER — PROPRANOLOL HCL 10 MG PO TABS
10.0000 mg | ORAL_TABLET | Freq: Three times a day (TID) | ORAL | Status: DC
  Administered 2020-08-15 – 2020-08-20 (×15): 10 mg via ORAL
  Filled 2020-08-15 (×23): qty 1

## 2020-08-15 MED ORDER — OLANZAPINE 10 MG PO TABS
20.0000 mg | ORAL_TABLET | Freq: Every day | ORAL | Status: DC
  Administered 2020-08-15 – 2020-08-19 (×5): 20 mg via ORAL
  Filled 2020-08-15 (×7): qty 2

## 2020-08-15 NOTE — Progress Notes (Addendum)
   08/15/20 2000  Psych Admission Type (Psych Patients Only)  Admission Status Voluntary  Psychosocial Assessment  Patient Complaints Anxiety;Depression  Eye Contact Brief  Facial Expression Worried  Affect Anxious;Preoccupied (somatic complaints)  Speech Logical/coherent;Soft;Slow  Interaction Cautious;Forwards little;Minimal;Guarded  Motor Activity Restless  Appearance/Hygiene Unremarkable  Behavior Characteristics Cooperative;Anxious  Mood Anxious;Depressed;Sad;Preoccupied  Thought Process  Coherency WDL  Content Delusions  Delusions Somatic  Perception Hallucinations  Hallucination Auditory;Visual (the voices talk to him about illnesses)  Judgment Impaired  Confusion None  Danger to Self  Current suicidal ideation? Denies  Danger to Others  Danger to Others None reported or observed   Interpreter Gabby [phone] - Pt denies SI, HI. Pt endorses AVH saying he hears and sees people in his head talking to him about illnesses.Pt says he has a headache 5/10. Pt guarded and anxious but cooperative.

## 2020-08-15 NOTE — Progress Notes (Signed)
   08/15/20 0500  Psych Admission Type (Psych Patients Only)  Admission Status Voluntary  Psychosocial Assessment  Patient Complaints Anxiety  Eye Contact Fair  Facial Expression Worried  Affect Anxious;Appropriate to circumstance;Preoccupied  Speech Logical/coherent;Soft;Slow  Interaction Cautious;Forwards little;Minimal;Guarded  Motor Activity Fidgety;Pacing;Restless  Appearance/Hygiene Unremarkable  Behavior Characteristics Cooperative;Appropriate to situation;Anxious  Mood Anxious  Thought Process  Coherency WDL  Content WDL  Delusions WDL  Perception Hallucinations;WDL  Hallucination None reported or observed  Judgment WDL  Confusion WDL  Danger to Self  Current suicidal ideation? Denies  Danger to Others  Danger to Others None reported or observed

## 2020-08-15 NOTE — Progress Notes (Signed)
[facility] MD Progress Note  08/15/2020 3:42 PM Darryl Wang  MRN:  [id number]   Reason for admission:Darryl Ortizis a 55 year old Spanish-speaking male with a history of anxiety and depression admitted for worsening symptoms of depression, increased anxiety, paranoia, hallucinations, responding to internal stimuli, not sleeping, not eating, appearing out of touch with reality and suicidal ideation with a plan to jump [place] a [place].  Objective: Medical record reviewed. Patient's case discussed in detail with members of the treatment team.  I met with and evaluated the patient this morning for follow-up with the assistance of Spanish-speaking translator [phone] through translation app.  The patient is much more alert during our conversation today than he was yesterday.  He maintains good eye contact.  There are no motor abnormalities.  Patient reports that his mood is a little better today.  He feels less anxious,"calmer".  Patient states he slept a bit better last night but still had difficulty falling asleep due to worry and concerns about urination.  He reports the same previously reported concerns about urination at night which include frequent attempts to urinate but at times he is unable to urinate because he has already emptied his bladder.  He then drinks water and continues to try to urinate.  He denies any pain with urination or other urinary symptoms.  He denies constipation.  The patient reports good appetite.  He continues to report AH which he vaguely describes as music.  He has some hypnopompic or hypnagogic visual hallucinations that his wife is present.  He denies any visual hallucinations when he is wide-awake.  The patient is oriented to month, year, city, place, self and time of day.  He knows it is the middle of May.  He denies passive wish for death, SI, AI, HI or PI.  The patient denies any side effects to his medications.  He denies any other physical problems.  The patient slept 3.5 hours last  night.  Vital signs this morning include BP of 126/98 sitting and 125/83 standing, pulse of 66 sitting and 75 standing, O2 sat of 99% and respirations of 16.  There are no new labs.  Staff have documented that the patient has been attending groups but participation is limited due to language barrier.  Principal Problem: MDD (major depressive disorder), recurrent, severe, with psychosis ([place]) Diagnosis: Principal Problem:   MDD (major depressive disorder), recurrent, severe, with psychosis ([place]) Active Problems:   Anxiety state  Total Time spent with patient: 20 minutes  Past Psychiatric History: see admission H&P  Past Medical History:  Past Medical History:  Diagnosis Date  . Depression    previously treated with zoloft (not helpful) and prozac (made him anxious). Was under care of male therapist, about 7 yrs ago.   History reviewed. No pertinent surgical history. Family History:  Family History  Problem Relation Age of Onset  . Arthritis Mother   . Depression Mother   . Alcohol abuse Father   . Diabetes Father   . Kidney disease Brother   . Depression Maternal Grandmother    Family Psychiatric  History: see admission H&P Social History:  Social History   Substance and Sexual Activity  Alcohol Use No     Social History   Substance and Sexual Activity  Drug Use No    Social History   Socioeconomic History  . Marital status: Married    Spouse name: Not on file  . Number of children: Not on file  . Years of education: Not on  file  . Highest education level: Not on file  Occupational History  . Not on file  Tobacco Use  . Smoking status: Never Smoker  . Smokeless tobacco: Never Used  Substance and Sexual Activity  . Alcohol use: No  . Drug use: No  . Sexual activity: Not on file  Other Topics Concern  . Not on file  Social History Narrative  . Not on file   Social Determinants of Health   Financial Resource Strain: Not on file  Food Insecurity: Not on  file  Transportation Needs: Not on file  Physical Activity: Not on file  Stress: Not on file  Social Connections: Not on file   Additional Social History:    Pain Medications: See MAR Prescriptions: See MAR Over the Counter: See MAR History of alcohol / drug use?: No history of alcohol / drug abuse (Pt denies.)                    Sleep: Poor  Appetite:  Good  Current Medications: Current Facility-Administered Medications  Medication Dose Route Frequency Provider Last Rate Last Admin  . acetaminophen (TYLENOL) tablet 650 mg  650 mg Oral Q6H PRN Ethelene Hal, NP      . alum & mag hydroxide-simeth (MAALOX/MYLANTA) 200-200-20 MG/5ML suspension 30 mL  30 mL Oral Q4H PRN Ethelene Hal, NP      . escitalopram (LEXAPRO) tablet 10 mg  10 mg Oral Daily [name], MD   10 mg at 08/15/20 1008  . feeding supplement (ENSURE ENLIVE / ENSURE PLUS) liquid 237 mL  237 mL Oral BID BM [name], [name], MD   237 mL at 08/15/20 1451  . LORazepam (ATIVAN) tablet 0.5 mg  0.5 mg Oral Q6H PRN [name], MD   0.5 mg at 08/15/20 0200  . magnesium hydroxide (MILK OF MAGNESIA) suspension 30 mL  30 mL Oral Daily PRN Ethelene Hal, NP      . melatonin tablet 5 mg  5 mg Oral QHS [name], MD   5 mg at 08/14/20 2135  . OLANZapine (ZYPREXA) tablet 20 mg  20 mg Oral QHS [name], MD      . OLANZapine zydis (ZYPREXA) disintegrating tablet 5 mg  5 mg Oral Q8H PRN [name], MD      . propranolol (INDERAL) tablet 10 mg  10 mg Oral Q8H [name], MD      . traZODone (DESYREL) tablet 50 mg  50 mg Oral QHS PRN [name], MD   50 mg at 08/15/20 0200    Lab Results:  Results for orders placed or performed during the hospital encounter of 08/10/20 (from the past 48 hour(s))  Urinalysis, Routine w reflex microscopic Urine, Clean Catch     Status: Abnormal   Collection Time: 08/14/20  4:08 AM  Result Value Ref Range   Color, Urine STRAW (A) YELLOW    APPearance CLEAR CLEAR   Specific Gravity, Urine 1.006 1.005 - 1.030   pH 6.0 5.0 - 8.0   Glucose, UA NEGATIVE NEGATIVE mg/dL   Hgb urine dipstick NEGATIVE NEGATIVE   Bilirubin Urine NEGATIVE NEGATIVE   Ketones, ur NEGATIVE NEGATIVE mg/dL   Protein, ur NEGATIVE NEGATIVE mg/dL   Nitrite NEGATIVE NEGATIVE   Leukocytes,Ua NEGATIVE NEGATIVE    Comment: Performed at [facility], [facility] [address], [place], [place] [address]    Blood Alcohol level:  Lab Results  Component  Value Date   ETH <10 61/44/3154    Metabolic Disorder Labs: Lab Results  Component Value Date   HGBA1C 6.2 (H) 08/08/2020   MPG 131.24 08/08/2020   Lab Results  Component Value Date   PROLACTIN 6.5 08/08/2020   Lab Results  Component Value Date   CHOL 219 (H) 08/12/2020   TRIG 133 08/12/2020   HDL 72 08/12/2020   CHOLHDL 3.0 08/12/2020   VLDL 27 08/12/2020   LDLCALC 120 (H) 08/12/2020   LDLCALC 129 (H) 08/08/2020    Physical Findings: AIMS: Facial and Oral Movements Muscles of Facial Expression: None, normal Lips and Perioral Area: None, normal Jaw: None, normal Tongue: None, normal,Extremity Movements Upper (arms, wrists, hands, fingers): None, normal Lower (legs, knees, ankles, toes): None, normal, Trunk Movements Neck, shoulders, hips: None, normal, Overall Severity Severity of abnormal movements (highest score from questions above): None, normal Incapacitation due to abnormal movements: None, normal Patient's awareness of abnormal movements (rate only patient's report): No Awareness, Dental Status Current problems with teeth and/or dentures?: No Does patient usually wear dentures?: No  CIWA:    COWS:     Psychiatric Specialty Exam:  Presentation  General Appearance: Appropriate for Environment; Casual  Eye Contact:Good  Speech:Clear and Coherent; Normal Rate  Speech Volume:Normal  Handedness:Right   Mood and Affect  Mood:Depressed ("A little better.  Calmer") Still anxious at times. Affect:Congruent; Constricted   Thought Process  Thought Processes:Coherent  Descriptions of Associations:Tangential  Orientation:Full (Time, Place and Person)  Thought Content:Rumination; Perseveration (Somatic focus; perseverates on perceived urination difficulty)  History of Schizophrenia/Schizoaffective disorder:No  Duration of Psychotic Symptoms:Less than six months  Hallucinations:Hallucinations: Auditory (Reports hearing music) Description of Visual Hallucinations: talking to his family then they disappear, he's unsure if he's awake or asleep, what's real vs. not  Ideas of Reference:None  Suicidal Thoughts:Suicidal Thoughts: No  Homicidal Thoughts:Homicidal Thoughts: No   Sensorium  Memory:Immediate Fair; Recent Fair  Judgment:Fair  Insight:Fair   Executive Functions  Concentration:Fair  Attention Span:Fair  [place]  Language:Good   Psychomotor Activity  Psychomotor Activity:Psychomotor Activity: Normal   Assets  Assets:Communication Skills; Desire for Improvement; Housing; Resilience; Social Support   Sleep  Sleep:Sleep: Poor Number of Hours of Sleep: 3.5    Physical Exam: Physical Exam Constitutional:      Appearance: Normal appearance.     Comments: Intermittently dozes off during the interview but can be awakened without difficulty  HENT:     Head: Normocephalic.  Pulmonary:     Effort: Pulmonary effort is normal.  Neurological:     General: No focal deficit present.     Mental Status: He is alert and oriented to person, place, and time.    Review of Systems  Genitourinary: Negative for dysuria, frequency and urgency.       Still endorses "difficulty urinating"  All other systems reviewed and are negative.  Blood pressure 125/83, pulse 75, temperature 97.7 F (36.5 C), temperature source Oral, resp. rate 16, height 5' 5"  (1.651 m), weight 60.3 kg, SpO2 99 %. Body  mass index is 22.13 kg/m.   Treatment Plan Summary: Daily contact with patient to assess and evaluate symptoms and progress in treatment and Medication management  Continue every 15-minute observation status, no roommate  Plan to collect collateral from wife today  Psychosis -Increase Olanzapine to 50m Q 1900.   -Continue olanzapine 532mPRN for breakthrough agitation  Depression -Continue escitalopram 1065maily.  Anticipate further upward titration as tolerated/indicated. -Patient has  had trials of Wellbutrin (which caused increased anxiety) as well as trials of other SSRIs in the past that were not effective but duration and dose not known.  Insomnia -Increase melatonin to 5 mg Q 1900 -Continue trazodone 50 mg at bedtimePRNinsomnia   Anxiety -Increase propranolol to 10 mg every 8 hours -Continue lorazepam 0.62m q6h PRN anxiety  Urinary symptoms ("trouble urinating", no dysuria, urgency, frequency today) -Likely 2/2 anxiety but continue to monitor for possible UTI  -UA normal -Consider tracking I/Os for 24hr for objective data regarding "trouble urinating"  Disposition planning in progress.   MArthor Captain MD 08/15/2020, 3:42 PM

## 2020-08-15 NOTE — Progress Notes (Signed)
Pt presents with anxiety and delusional thinking.  Pt was lethargic during discussion with RN this morning, but pt has become more alert as the day has progressed.  Translator machine used to interact with pt for RN/Pt discussions.  Pt denied SI and HI.  Endorsed some "voices that I talk to at night while i'm sleeping."  Pt is in no acute distress at this time.  RN will continue to monitor and provide support as needed.

## 2020-08-16 NOTE — Progress Notes (Signed)
Pt anxious and not sleeping. Given Trazodone 50 mg and Ativan 0.5 mg.

## 2020-08-16 NOTE — Progress Notes (Signed)
Progress note    08/16/20 0733  Psych Admission Type (Psych Patients Only)  Admission Status Voluntary  Psychosocial Assessment  Patient Complaints Anxiety  Eye Contact Fair  Facial Expression Anxious;Pensive;Worried  Affect Anxious;Preoccupied  Speech Logical/coherent;Language other than English  Interaction Cautious;Forwards little;Guarded;Minimal  Motor Activity Pacing;Restless  Appearance/Hygiene Unremarkable  Behavior Characteristics Cooperative;Appropriate to situation;Anxious  Mood Anxious;Preoccupied;Pleasant  Thought Process  Coherency Blocking  Content Paranoia  Delusions Paranoid  Perception Hallucinations  Hallucination Auditory;Visual  Judgment Poor  Confusion None  Danger to Self  Current suicidal ideation? Denies  Danger to Others  Danger to Others None reported or observed

## 2020-08-16 NOTE — Progress Notes (Signed)
Recreation Therapy Notes  Date: 08/16/2020 Time: 900a  Topic: Stress Management   Goal Area(s) Addresses:  Patient will review and complete packet supporting identification of stressors and and techniques to combat compounding stress.   Intervention: Independent Workbook   Education: Stress, Time Management, Coping Skills, Lifestyle Changes, Discharge Planning   Comments: LRT provided pt a workbook reviewing stress management concepts and offering an opportunity to create a plan to address stressors. Pt given the option to complete the packet in the dayroom with RN/MHT staff and peers or at their own pace in their room.   Jaeger Trueheart, LRT/CTRS 

## 2020-08-16 NOTE — B[facility] Group Notes (Signed)
Type of Therapy and Topic: Group Therapy: Overcoming Obstacles  Participation Level: Minimal  Description of Group:  In this group patients will watch a Ted Talk titled, "Why We Should All Try Therapy". In this video, patients explore the benefits of going to therapy and how this choice may effect their life. This video also explores other emotions that may arise by attending therapy. Patients were also given a worksheet titled "Therapy Goals" that allows patients to explore their goals for therapy and how their life may be different once they have completed therapy.    Therapeutic Goals:  1. Patient will identify personal and current emotions as they relate to therapy.  2. Patient will identify barriers that currently interfere with them beginning therapy.  3. Patient will identify two goals they are willing to set for therapy.     Summary of Patient Progress: Pt came to group late, after introductions. Pt was attentive to the TedTalk but did not share during discussion.   Toney Reil, [place] Worker [employer]

## 2020-08-16 NOTE — Tx Team (Signed)
Interdisciplinary Treatment and Diagnostic Plan Update  08/16/2020 Time of Session: 9:35am Darryl Wang MRN: [id number]  Principal Diagnosis: MDD (major depressive disorder), recurrent, severe, with psychosis ([place])  Secondary Diagnoses: Principal Problem:   MDD (major depressive disorder), recurrent, severe, with psychosis ([place]) Active Problems:   Anxiety state   Current Medications:  Current Facility-Administered Medications  Medication Dose Route Frequency Provider Last Rate Last Admin  . acetaminophen (TYLENOL) tablet 650 mg  650 mg Oral Q6H PRN Ethelene Hal, NP   650 mg at 08/15/20 2113  . alum & mag hydroxide-simeth (MAALOX/MYLANTA) 200-200-20 MG/5ML suspension 30 mL  30 mL Oral Q4H PRN Ethelene Hal, NP      . escitalopram (LEXAPRO) tablet 10 mg  10 mg Oral Daily [name], MD   10 mg at 08/16/20 5027  . feeding supplement (ENSURE ENLIVE / ENSURE PLUS) liquid 237 mL  237 mL Oral BID BM [name], [name], MD   237 mL at 08/16/20 1042  . LORazepam (ATIVAN) tablet 0.5 mg  0.5 mg Oral Q6H PRN [name], MD   0.5 mg at 08/16/20 0110  . magnesium hydroxide (MILK OF MAGNESIA) suspension 30 mL  30 mL Oral Daily PRN Ethelene Hal, NP      . melatonin tablet 5 mg  5 mg Oral QHS [name], MD   5 mg at 08/15/20 2113  . OLANZapine (ZYPREXA) tablet 20 mg  20 mg Oral QHS [name], MD   20 mg at 08/15/20 2113  . OLANZapine zydis (ZYPREXA) disintegrating tablet 5 mg  5 mg Oral Q8H PRN [name], MD      . propranolol (INDERAL) tablet 10 mg  10 mg Oral Q8H [name], MD   10 mg at 08/16/20 0550  . traZODone (DESYREL) tablet 50 mg  50 mg Oral QHS PRN [name], MD   50 mg at 08/16/20 0109   PTA Medications: Medications Prior to Admission  Medication Sig Dispense Refill Last Dose  . buPROPion (WELLBUTRIN XL) 150 MG 24 hr tablet Take 150 mg by mouth every morning.     . hydrOXYzine (ATARAX/VISTARIL) 25 MG tablet Take 1 tablet (25 mg  total) by mouth every 6 (six) hours as needed for anxiety or vomiting. (Patient taking differently: Take 25 mg by mouth 3 (three) times daily as needed for anxiety.) 30 tablet 0   . traZODone (DESYREL) 50 MG tablet Take 1 tablet (50 mg total) by mouth at bedtime and may repeat dose one time if needed. 15 tablet 0     Patient Stressors: Other: mental illness  Patient Strengths: Average or above average intelligence Capable of independent living Communication skills Physical Health Supportive family/friends Work skills  Treatment Modalities: Medication Management, Group therapy, Case management,  1 to 1 session with clinician, Psychoeducation, Recreational therapy.   Physician Treatment Plan for Primary Diagnosis: MDD (major depressive disorder), recurrent, severe, with psychosis ([place]) Long Term Goal(s): Improvement in symptoms so as ready for discharge Improvement in symptoms so as ready for discharge   Short Term Goals: Ability to identify changes in lifestyle to reduce recurrence of condition will improve Ability to verbalize feelings will improve Ability to disclose and discuss suicidal ideas Ability to identify and develop effective coping behaviors will improve Compliance with prescribed medications will improve Ability to identify triggers associated with substance abuse/mental health issues will improve Ability to identify changes in lifestyle to reduce recurrence of condition will improve Ability to  verbalize feelings will improve Ability to disclose and discuss suicidal ideas Ability to identify and develop effective coping behaviors will improve Compliance with prescribed medications will improve Ability to identify triggers associated with substance abuse/mental health issues will improve  Medication Management: Evaluate patient's response, side effects, and tolerance of medication regimen.  Therapeutic Interventions: 1 to 1 sessions, Unit Group sessions and Medication  administration.  Evaluation of Outcomes: Progressing  Physician Treatment Plan for Secondary Diagnosis: Principal Problem:   MDD (major depressive disorder), recurrent, severe, with psychosis ([place]) Active Problems:   Anxiety state  Long Term Goal(s): Improvement in symptoms so as ready for discharge Improvement in symptoms so as ready for discharge   Short Term Goals: Ability to identify changes in lifestyle to reduce recurrence of condition will improve Ability to verbalize feelings will improve Ability to disclose and discuss suicidal ideas Ability to identify and develop effective coping behaviors will improve Compliance with prescribed medications will improve Ability to identify triggers associated with substance abuse/mental health issues will improve Ability to identify changes in lifestyle to reduce recurrence of condition will improve Ability to verbalize feelings will improve Ability to disclose and discuss suicidal ideas Ability to identify and develop effective coping behaviors will improve Compliance with prescribed medications will improve Ability to identify triggers associated with substance abuse/mental health issues will improve     Medication Management: Evaluate patient's response, side effects, and tolerance of medication regimen.  Therapeutic Interventions: 1 to 1 sessions, Unit Group sessions and Medication administration.  Evaluation of Outcomes: Progressing   RN Treatment Plan for Primary Diagnosis: MDD (major depressive disorder), recurrent, severe, with psychosis ([place]) Long Term Goal(s): Knowledge of disease and therapeutic regimen to maintain health will improve  Short Term Goals: Ability to remain free from injury will improve, Ability to verbalize frustration and anger appropriately will improve, Ability to identify and develop effective coping behaviors will improve and Compliance with prescribed medications will improve  Medication Management: RN  will administer medications as ordered by provider, will assess and evaluate patient's response and provide education to patient for prescribed medication. RN will report any adverse and/or side effects to prescribing provider.  Therapeutic Interventions: 1 on 1 counseling sessions, Psychoeducation, Medication administration, Evaluate responses to treatment, Monitor vital signs and CBGs as ordered, Perform/monitor CIWA, COWS, AIMS and Fall Risk screenings as ordered, Perform wound care treatments as ordered.  Evaluation of Outcomes: Progressing   LCSW Treatment Plan for Primary Diagnosis: MDD (major depressive disorder), recurrent, severe, with psychosis ([place]) Long Term Goal(s): Safe transition to appropriate next level of care at discharge, Engage patient in therapeutic group addressing interpersonal concerns.  Short Term Goals: Engage patient in aftercare planning with referrals and resources, Increase social support, Increase ability to appropriately verbalize feelings, Increase emotional regulation, Identify triggers associated with mental health/substance abuse issues and Increase skills for wellness and recovery  Therapeutic Interventions: Assess for all discharge needs, 1 to 1 time with Social worker, Explore available resources and support systems, Assess for adequacy in community support network, Educate family and significant other(s) on suicide prevention, Complete Psychosocial Assessment, Interpersonal group therapy.  Evaluation of Outcomes: Progressing   Progress in Treatment: Attending groups: No. Participating in groups: No. Taking medication as prescribed: Yes. Toleration medication: Yes. Family/Significant other contact made: Yes, individual(s) contacted:  wife Patient understands diagnosis: No. Discussing patient identified problems/goals with staff: Yes. Medical problems stabilized or resolved: Yes. Denies suicidal/homicidal ideation: Yes. Issues/concerns per patient  self-inventory: No.   New problem(s) identified: No,  Describe:  none  New Short Term/Long Term Goal(s): medication stabilization, elimination of SI thoughts, development of comprehensive mental wellness plan.    Patient Goals:  Did not attend  Discharge Plan or Barriers: Is scheduled to receive medication management through [place] and has an established therapist he will return to at discharge.  Reason for Continuation of Hospitalization: Delusions  Hallucinations Medication stabilization  Estimated Length of Stay: 3-5 days  Attendees: Patient: Did not attend 08/16/2020 11:23 AM  Physician:  08/16/2020 11:23 AM  Nursing:  08/16/2020 11:23 AM  RN Care Manager: 08/16/2020 11:23 AM  Social Worker: Darletta Moll, LCSW 08/16/2020 11:23 AM  Recreational Therapist:  08/16/2020 11:23 AM  Other:  08/16/2020 11:23 AM  Other:  08/16/2020 11:23 AM  Other: 08/16/2020 11:23 AM    Scribe for Treatment Team: Mliss Fritz, [place] 08/16/2020 11:23 AM

## 2020-08-16 NOTE — [facility] Group Notes (Signed)
Pt did not attend morning goals group. 

## 2020-08-16 NOTE — Progress Notes (Signed)
Pt out of his room saying his sleep has been disrupted by having to use the bathroom throughout the night. Pt comes back out 20 minutes later and gets a full pitcher of water.

## 2020-08-16 NOTE — Progress Notes (Signed)
[facility] MD Progress Note  08/16/2020 2:59 PM [name]  MRN:  [id number]   Reason for admission:Darryl Ortizis a 55 year old Spanish-speaking male with a history of anxiety and depression admitted for worsening symptoms of depression, increased anxiety, paranoia, hallucinations, responding to internal stimuli, not sleeping, not eating, appearing out of touch with reality and suicidal ideation with a plan to jump in a lake.  Objective: Medical record reviewed. Patient's case discussed in detail with members of the treatment team.  I met with and evaluated the patient this morning for follow-up with the assistance of Spanish-speaking translator [phone] through translation app.  Patient appears tired on interview but keeps eyes open for most of the conversation.  He describes his mood as anxious and nervous and a little sad.  Patient reports sleeping poorly last night again due to worry that he would not being able to urinate and repeatedly attempting to urinate to alleviate this worry.  He denies pain with urination or other urinary symptoms.  He has been drinking fluids.  During the day he is not preoccupied with worries about urinating.  The patient denies passive wish for death or suicidal ideation.  He reports experiencing a noise that goes from his stomach to his head telling him things he has to do and making him believe things that are not true such as telling him "illogical things about food or medication."  Patient is unable to elaborate further.  Patient states, "it feels like my body will try to attack me through what I am trying to say."  Patient describes hypnopompic/hypnagogic visual experiences of seeing and conversing with his wife at night as he is falling asleep or waking up from sleep.  He describes his appetite is good.  He denies medication side effects other than a mild headache.  Patient is oriented to month, year, place, self, city.  He does not know the date but knows it is the middle  of May.  He slept 1.5 hours last night.  Most recent vital signs include BP 129/83, pulse of 86, temperature of 98.1 and O2 sat of 99%.  Patient was awake much of the night to urinate and to drink pitchers of water.  He has attended some groups but participation is limited due to language barrier.  Principal Problem: MDD (major depressive disorder), recurrent, severe, with psychosis ([place]) Diagnosis: Principal Problem:   MDD (major depressive disorder), recurrent, severe, with psychosis ([name]) Active Problems:   Anxiety state  Total Time spent with patient: 20 minutes  Past Psychiatric History: see admission H&P  Past Medical History:  Past Medical History:  Diagnosis Date  . Depression    previously treated with zoloft (not helpful) and prozac (made him anxious). Was under care of male therapist, about 7 yrs ago.   History reviewed. No pertinent surgical history. Family History:  Family History  Problem Relation Age of Onset  . Arthritis Mother   . Depression Mother   . Alcohol abuse Father   . Diabetes Father   . Kidney disease Brother   . Depression Maternal Grandmother    Family Psychiatric  History: see admission H&P Social History:  Social History   Substance and Sexual Activity  Alcohol Use No     Social History   Substance and Sexual Activity  Drug Use No    Social History   Socioeconomic History  . Marital status: Married    Spouse name: Not on file  . Number of children: Not on file  .  Years of education: Not on file  . Highest education level: Not on file  Occupational History  . Not on file  Tobacco Use  . Smoking status: Never Smoker  . Smokeless tobacco: Never Used  Substance and Sexual Activity  . Alcohol use: No  . Drug use: No  . Sexual activity: Not on file  Other Topics Concern  . Not on file  Social History Narrative  . Not on file   Social Determinants of Health   Financial Resource Strain: Not on file  Food Insecurity: Not on file   Transportation Needs: Not on file  Physical Activity: Not on file  Stress: Not on file  Social Connections: Not on file   Additional Social History:    Pain Medications: See MAR Prescriptions: See MAR Over the Counter: See MAR History of alcohol / drug use?: No history of alcohol / drug abuse (Pt denies.)                    Sleep: Poor  Appetite:  Good  Current Medications: Current Facility-Administered Medications  Medication Dose Route Frequency Provider Last Rate Last Admin  . acetaminophen (TYLENOL) tablet 650 mg  650 mg Oral Q6H PRN Ethelene Hal, NP   650 mg at 08/15/20 2113  . alum & mag hydroxide-simeth (MAALOX/MYLANTA) 200-200-20 MG/5ML suspension 30 mL  30 mL Oral Q4H PRN Ethelene Hal, NP      . escitalopram (LEXAPRO) tablet 10 mg  10 mg Oral Daily [name], MD   10 mg at 08/16/20 1610  . feeding supplement (ENSURE ENLIVE / ENSURE PLUS) liquid 237 mL  237 mL Oral BID BM [name], [name], MD   237 mL at 08/16/20 1404  . LORazepam (ATIVAN) tablet 0.5 mg  0.5 mg Oral Q6H PRN [name], MD   0.5 mg at 08/16/20 0110  . magnesium hydroxide (MILK OF MAGNESIA) suspension 30 mL  30 mL Oral Daily PRN Ethelene Hal, NP      . melatonin tablet 5 mg  5 mg Oral QHS [name], MD   5 mg at 08/15/20 2113  . OLANZapine (ZYPREXA) tablet 20 mg  20 mg Oral QHS [name], MD   20 mg at 08/15/20 2113  . OLANZapine zydis (ZYPREXA) disintegrating tablet 5 mg  5 mg Oral Q8H PRN [name], MD      . propranolol (INDERAL) tablet 10 mg  10 mg Oral Q8H [name], MD   10 mg at 08/16/20 1409  . traZODone (DESYREL) tablet 50 mg  50 mg Oral QHS PRN [name], MD   50 mg at 08/16/20 0109    Lab Results:  No results found for this or any previous visit (from the past 48 hour(s)).  Blood Alcohol level:  Lab Results  Component Value Date   ETH <10 96/06/5407    Metabolic Disorder Labs: Lab Results  Component Value Date    HGBA1C 6.2 (H) 08/08/2020   MPG 131.24 08/08/2020   Lab Results  Component Value Date   PROLACTIN 6.5 08/08/2020   Lab Results  Component Value Date   CHOL 219 (H) 08/12/2020   TRIG 133 08/12/2020   HDL 72 08/12/2020   CHOLHDL 3.0 08/12/2020   VLDL 27 08/12/2020   LDLCALC 120 (H) 08/12/2020   LDLCALC 129 (H) 08/08/2020    Physical Findings: AIMS: Facial and Oral Movements Muscles of Facial Expression: None, normal Lips and Perioral  Area: None, normal Jaw: None, normal Tongue: None, normal,Extremity Movements Upper (arms, wrists, hands, fingers): None, normal Lower (legs, knees, ankles, toes): None, normal, Trunk Movements Neck, shoulders, hips: None, normal, Overall Severity Severity of abnormal movements (highest score from questions above): None, normal Incapacitation due to abnormal movements: None, normal Patient's awareness of abnormal movements (rate only patient's report): No Awareness, Dental Status Current problems with teeth and/or dentures?: No Does patient usually wear dentures?: No  CIWA:    COWS:     Psychiatric Specialty Exam:  Presentation  General Appearance: Appropriate for Environment; Casual  Eye Contact:Good  Speech:Normal Rate  Speech Volume:Normal  Handedness:Right   Mood and Affect  Mood:Anxious Still anxious at times. Affect:Constricted   Thought Process  Thought Processes:Coherent  Descriptions of Associations:Tangential  Orientation:Full (Time, Place and Person)  Thought Content:Illogical; Rumination (Intermittently illogical; somatic focus)  History of Schizophrenia/Schizoaffective disorder:No  Duration of Psychotic Symptoms:Less than six months  Hallucinations:Hallucinations: Other (comment) (Reports his brain receives messages from his stomach; hypnopompic/hypnagogic visual hallucinations of having conversations with his wife)  Ideas of Reference:None  Suicidal Thoughts:Suicidal Thoughts: No  Homicidal  Thoughts:Homicidal Thoughts: No   Sensorium  Memory:Immediate Fair; Recent Fair; Remote Fair  Judgment:Fair  Insight:Fair   Executive Functions  Concentration:Fair  Attention Span:Fair  [place]  Language:Good   Psychomotor Activity  Psychomotor Activity:Psychomotor Activity: Normal   Assets  Assets:Communication Skills; Desire for Improvement; Housing; Resilience; Social Support   Sleep  Sleep:Sleep: Poor Number of Hours of Sleep: 1.5    Physical Exam: Physical Exam Constitutional:      Appearance: Normal appearance.  HENT:     Head: Normocephalic.  Pulmonary:     Effort: Pulmonary effort is normal.  Neurological:     General: No focal deficit present.     Mental Status: He is alert and oriented to person, place, and time.    Review of Systems  Genitourinary: Negative for dysuria, frequency and urgency.       Still endorses "difficulty urinating"   All other systems reviewed and are negative.  Blood pressure 129/83, pulse 86, temperature 98.1 F (36.7 C), temperature source Oral, resp. rate 16, height 5' 5"  (1.651 m), weight 60.3 kg, SpO2 99 %. Body mass index is 22.13 kg/m.   Treatment Plan Summary: Daily contact with patient to assess and evaluate symptoms and progress in treatment and Medication management  Continue every 15-minute observation status, no roommate  Plan to collect collateral from wife today  Psychosis -Continue Olanzapine 76m Q 1900.   -Continue olanzapine 517mPRN for breakthrough agitation  Depression -Continue escitalopram 1051maily.  Anticipate further upward titration as tolerated/indicated. -Patient has had trials of Wellbutrin (which caused increased anxiety) as well as trials of other SSRIs in the past that were not effective but duration and dose not known.  Insomnia -Continue melatonin 5 mg Q 1900 -Continue trazodone 50 mg at bedtimePRNinsomnia   Anxiety -Continue Lexapro  10 mg daily. -Continue propranolol 10 mg every 8 hours -Continue lorazepam 0.5mg31mh PRN anxiety  Urinary symptoms ("trouble urinating", no dysuria, urgency, frequency today) -Likely 2/2 anxiety but continue to monitor for possible UTI  -UA normal -Is/Os ordered  Disposition planning in progress.   MartArthor Captain 08/16/2020, 2:59 PM

## 2020-08-16 NOTE — Plan of Care (Signed)
  Problem: Coping: Goal: Ability to demonstrate self-control will improve Outcome: Progressing   Problem: Health Behavior/Discharge Planning: Goal: Identification of resources available to assist in meeting health care needs will improve Outcome: Progressing Goal: Compliance with treatment plan for underlying cause of condition will improve Outcome: Progressing

## 2020-08-17 DIAGNOSIS — F333 Major depressive disorder, recurrent, severe with psychotic symptoms: Secondary | ICD-10-CM | POA: Diagnosis not present

## 2020-08-17 NOTE — Progress Notes (Signed)
D. Per interpreter ID # [id number]- pt continues to report anxiety, a 4/10, and appears to be somewhat fixated on his urination. So far, pt's urinary output total is around 1100 cc's. Pt was given personal pitcher of Gatorade this am. Pt has been visible in the milieu attending groups, and had gone down to the gym for rec therapy. Pt currently denies SI/HI and racing thoughts. A. Labs and vitals monitored. Pt compliant with medications. Pt supported emotionally and encouraged to express concerns and ask questions.   R. Pt remains safe with 15 minute checks. Will continue POC.

## 2020-08-17 NOTE — [facility] Group Notes (Signed)
.  Psychoeducational Group Note  Date: 08/17/2020 Time: 0900-1000    Goal Setting   Purpose of Group: This group helps to provide patients with the steps of setting a goal that is specific, measurable, attainable, realistic and time specific. A discussion on how we keep ourselves stuck with negative self talk.    Participation Level:  limited  Participation Quality:  limited  Affect:  flat  Cognitive:  Appropriate  Insight:  Is aware  Engagement in Group:  Pt came in at the beginning of the group and then was called out to speak with the doctor. Came back into the group, as it was ending.  Additional Comments:   Darryl Wang

## 2020-08-17 NOTE — Progress Notes (Addendum)
   08/16/20 2145  Psych Admission Type (Psych Patients Only)  Admission Status Voluntary  Psychosocial Assessment  Patient Complaints Anxiety;Depression  Eye Contact Fair  Facial Expression Anxious;Pensive;Worried  Affect Anxious;Preoccupied  Speech Logical/coherent;Language other than English  Interaction Cautious;Forwards little;Guarded;Minimal  Motor Activity Pacing;Restless  Appearance/Hygiene Unremarkable  Behavior Characteristics Cooperative;Anxious;Pacing;Fidgety  Mood Depressed;Anxious;Preoccupied  Thought Pension scheme manager thinking  Content Paranoia;Delusions  Delusions Somatic  Perception Hallucinations  Hallucination Visual  Judgment Poor  Confusion None  Danger to Self  Current suicidal ideation? Denies  Danger to Others  Danger to Others None reported or observed   Used interpreter Tania [phone], to speak with pt at length this evening. Pt has been pacing the unit since beginning of shift. Pt denies SI, HI. Pt endorses VH of seeing people in his head but they are not talking to him today. Pt endorses headache 5/10. Pt rates anxiety 5/10 and depression 5/10.  Pt still obsessing about drinking water and going to the bathroom. Pt told to drink more during the day to urinate since he says that going to the bathroom at night is keeping him up. Pt educated on the use of the assistive device in the toilet to measure his urine. Pt says no one told him about it so he wasn't using it. Said we had to measure his urine to ensure he is voiding properly.  Pt endorses sleeping during the day and gets some rest. Pt told that he needed to demonstrate that he can sleep well and through the night. Pt verbalized understanding.

## 2020-08-17 NOTE — Progress Notes (Addendum)
[facility] MD Progress Note  08/17/2020 10:35 AM Darryl Wang  MRN:  [id number]   Subjective: Darryl Wang reported " I am doing okay still a little anxious."  Evaluation: Darryl Wang was seen and evaluated face-to-face.  Translation was provided by Altamese [employer]al ID number [id number].  Patient continues to ruminate with urine output.  Reported waking up feeling slightly anxious this morning.  Denying suicidal or homicidal ideations.  Denies auditory or visual hallucinations.  Per treatment team urine output has been monitored.  Chart reviewed urine analysis within normal limits. A1c 6.2 prediabetes noted.   Staff to continue to monitor urinary output.  Darryl Wang reports he has not been sleeping well at night, mainly due to the environment.  Reports a good appetite.  Reports taking and tolerating medications well.  He reports headache which is relieved with Tylenol.  RN to follow-up with in person translation for in Spanish translation for milia participation.  Patient was receptive to plan.  Support, encouragement and reassurance was provided.   Principal Problem: MDD (major depressive disorder), recurrent, severe, with psychosis ([place]) Diagnosis: Principal Problem:   MDD (major depressive disorder), recurrent, severe, with psychosis ([place]) Active Problems:   Anxiety state  Total Time spent with patient: 15 minutes  Past Psychiatric History:   Past Medical History:  Past Medical History:  Diagnosis Date  . Depression    previously treated with zoloft (not helpful) and prozac (made him anxious). Was under care of male therapist, about 7 yrs ago.   History reviewed. No pertinent surgical history. Family History:  Family History  Problem Relation Age of Onset  . Arthritis Mother   . Depression Mother   . Alcohol abuse Father   . Diabetes Father   . Kidney disease Brother   . Depression Maternal Grandmother    Family Psychiatric  History:  Social History:  Social History   Substance and Sexual Activity  Alcohol  Use No     Social History   Substance and Sexual Activity  Drug Use No    Social History   Socioeconomic History  . Marital status: Married    Spouse name: Not on file  . Number of children: Not on file  . Years of education: Not on file  . Highest education level: Not on file  Occupational History  . Not on file  Tobacco Use  . Smoking status: Never Smoker  . Smokeless tobacco: Never Used  Substance and Sexual Activity  . Alcohol use: No  . Drug use: No  . Sexual activity: Not on file  Other Topics Concern  . Not on file  Social History Narrative  . Not on file   Social Determinants of Health   Financial Resource Strain: Not on file  Food Insecurity: Not on file  Transportation Needs: Not on file  Physical Activity: Not on file  Stress: Not on file  Social Connections: Not on file   Additional Social History:    Pain Medications: See MAR Prescriptions: See MAR Over the Counter: See MAR History of alcohol / drug use?: No history of alcohol / drug abuse (Pt denies.)                    Sleep: Fair  Appetite:  Fair  Current Medications: Current Facility-Administered Medications  Medication Dose Route Frequency Provider Last Rate Last Admin  . acetaminophen (TYLENOL) tablet 650 mg  650 mg Oral Q6H PRN Ethelene Hal, NP   650 mg at 08/17/20 1003  . alum &  mag hydroxide-simeth (MAALOX/MYLANTA) 200-200-20 MG/5ML suspension 30 mL  30 mL Oral Q4H PRN Ethelene Hal, NP      . escitalopram (LEXAPRO) tablet 10 mg  10 mg Oral Daily [name], MD   10 mg at 08/17/20 0900  . feeding supplement (ENSURE ENLIVE / ENSURE PLUS) liquid 237 mL  237 mL Oral BID BM [name], [name], MD   237 mL at 08/17/20 0939  . LORazepam (ATIVAN) tablet 0.5 mg  0.5 mg Oral Q6H PRN [name], MD   0.5 mg at 08/16/20 0110  . magnesium hydroxide (MILK OF MAGNESIA) suspension 30 mL  30 mL Oral Daily PRN Ethelene Hal, NP      . melatonin tablet 5 mg  5 mg  Oral QHS [name], MD   5 mg at 08/16/20 2202  . OLANZapine (ZYPREXA) tablet 20 mg  20 mg Oral QHS [name], MD   20 mg at 08/16/20 2202  . OLANZapine zydis (ZYPREXA) disintegrating tablet 5 mg  5 mg Oral Q8H PRN [name], MD      . propranolol (INDERAL) tablet 10 mg  10 mg Oral Q8H [name], MD   10 mg at 08/17/20 3151  . traZODone (DESYREL) tablet 50 mg  50 mg Oral QHS PRN [name], MD   50 mg at 08/16/20 2203    Lab Results: No results found for this or any previous visit (from the past 48 hour(s)).  Blood Alcohol level:  Lab Results  Component Value Date   ETH <10 76/16/0737    Metabolic Disorder Labs: Lab Results  Component Value Date   HGBA1C 6.2 (H) 08/08/2020   MPG 131.24 08/08/2020   Lab Results  Component Value Date   PROLACTIN 6.5 08/08/2020   Lab Results  Component Value Date   CHOL 219 (H) 08/12/2020   TRIG 133 08/12/2020   HDL 72 08/12/2020   CHOLHDL 3.0 08/12/2020   VLDL 27 08/12/2020   LDLCALC 120 (H) 08/12/2020   LDLCALC 129 (H) 08/08/2020    Physical Findings: AIMS: Facial and Oral Movements Muscles of Facial Expression: None, normal Lips and Perioral Area: None, normal Jaw: None, normal Tongue: None, normal,Extremity Movements Upper (arms, wrists, hands, fingers): None, normal Lower (legs, knees, ankles, toes): None, normal, Trunk Movements Neck, shoulders, hips: None, normal, Overall Severity Severity of abnormal movements (highest score from questions above): None, normal Incapacitation due to abnormal movements: None, normal Patient's awareness of abnormal movements (rate only patient's report): No Awareness, Dental Status Current problems with teeth and/or dentures?: No Does patient usually wear dentures?: No  CIWA:    COWS:     Musculoskeletal: Strength & Muscle Tone: within normal limits Gait & Station: normal Patient leans: N/A  Psychiatric Specialty Exam:  Presentation  General Appearance:  Appropriate for Environment; Casual  Eye Contact:Good  Speech:Normal Rate  Speech Volume:Normal  Handedness:Right   Mood and Affect  Mood:Anxious  Affect:Constricted   Thought Process  Thought Processes:Coherent  Descriptions of Associations:Tangential  Orientation:Full (Time, Place and Person)  Thought Content:Illogical; Rumination (Intermittently illogical; somatic focus)  History of Schizophrenia/Schizoaffective disorder:No  Duration of Psychotic Symptoms:Less than six months  Hallucinations:Hallucinations: Other (comment) (Reports his brain receives messages from his stomach; hypnopompic/hypnagogic visual hallucinations of having conversations with his wife)  Ideas of Reference:None  Suicidal Thoughts:Suicidal Thoughts: No  Homicidal Thoughts:Homicidal Thoughts: No   Sensorium  Memory:Immediate Fair; Recent Fair; Remote [place]  Insight:Fair   Executive  Functions  Concentration:Fair  Attention Span:Fair  [place]  Language:Good   Psychomotor Activity  Psychomotor Activity:Psychomotor Activity: Normal   Assets  Assets:Communication Skills; Desire for Improvement; Housing; Resilience; Social Support   Sleep  Sleep:Sleep: Poor Number of Hours of Sleep: 1.5    Physical Exam: Physical Exam Vitals and nursing note reviewed.  Cardiovascular:     Rate and Rhythm: Normal rate and regular rhythm.  Neurological:     Mental Status: He is alert.  Psychiatric:        Attention and Perception: Attention normal.        Mood and Affect: Mood is anxious.        Speech: Speech normal.        Behavior: Behavior normal.        Thought Content: Thought content is paranoid.        Cognition and Memory: Memory is impaired.    ROS Blood pressure (!) 131/91, pulse 69, temperature 97.9 F (36.6 C), temperature source Oral, resp. rate 18, height 5\' 5"  (1.651 m), weight 60.3 kg, SpO2 98 %. Body mass index is  22.13 kg/m.   Treatment Plan Summary: Daily contact with patient to assess and evaluate symptoms and progress in treatment and Medication management    Continue with treatment plan on 08/17/2020 as listed below excepted where noted.  Major depressive disorders with psychosis:  Continue Zyprexa 20 mg po nightly Continue Lexapro 10 mg po daily Continue Inderal 10 mg po  BID Continue Trazodone 50 mg po nightly     CSW to continue working on discharge disposition RN to provided in person translation to participate in daily group sessions    [name], NP 08/17/2020, 10:35 AM

## 2020-08-17 NOTE — Progress Notes (Signed)
[facility] Group Notes:  (Nursing/MHT/Case Management/Adjunct)  Date:  08/17/2020  Time:  11:06 PM  Type of Therapy:  Group Therapy  Participation Level:  Active  Participation Quality:  Attentive  Affect:  Appropriate  Cognitive:  Alert  Insight:  Appropriate  Engagement in Group:  Developing/Improving  Modes of Intervention:  Discussion  Summary of Progress/Problems:  Maxine Glenn 08/17/2020, 11:06 PM

## 2020-08-18 DIAGNOSIS — F333 Major depressive disorder, recurrent, severe with psychotic symptoms: Secondary | ICD-10-CM | POA: Diagnosis not present

## 2020-08-18 MED ORDER — ESCITALOPRAM OXALATE 20 MG PO TABS
20.0000 mg | ORAL_TABLET | Freq: Every day | ORAL | Status: DC
  Administered 2020-08-19 – 2020-08-22 (×4): 20 mg via ORAL
  Filled 2020-08-18 (×6): qty 1

## 2020-08-18 MED ORDER — TRAZODONE HCL 100 MG PO TABS
100.0000 mg | ORAL_TABLET | Freq: Every evening | ORAL | Status: DC | PRN
  Administered 2020-08-18 – 2020-08-19 (×2): 100 mg via ORAL
  Filled 2020-08-18 (×2): qty 1

## 2020-08-18 NOTE — Progress Notes (Signed)
[facility] MD Progress Note  08/18/2020 10:41 AM Darryl Wang  MRN:  [id number]    Evaluation: Freida Busman observed pacing the unit.  Continues to endorse auditory hallucinations and increased anxiety.  States " it is hard to explain but I feel like my body is talking to me."  reports the symptoms started 3 or 4 days ago Patient remains focused on urination.  Case staffed with attending psychiatrist and  will titrate Lexapro to 20 mg daily. Patient was initiated on Lexapro and Zyprexa for mood stabilization.   Brandis denied any medication side effects.  Patient states he attempted to sit in group however due to language barrier was unable to process.  denied suicidal or homicidal ideations.  Was reported that patient hygiene continues to be of concern.  Patient encouraged bathe.  Vedder reported ongoing issues with sleep.  Increase trazodone 50 mg to 100 mg p.o. as needed.  Staff to continue to monitor for safety.  Support, encouragement and reassurance was provided.   Principal Problem: MDD (major depressive disorder), recurrent, severe, with psychosis ([place]) Diagnosis: Principal Problem:   MDD (major depressive disorder), recurrent, severe, with psychosis ([place]) Active Problems:   Anxiety state  Total Time spent with patient: 15 minutes  Past Psychiatric History:   Past Medical History:  Past Medical History:  Diagnosis Date  . Depression    previously treated with zoloft (not helpful) and prozac (made him anxious). Was under care of male therapist, about 7 yrs ago.   History reviewed. No pertinent surgical history. Family History:  Family History  Problem Relation Age of Onset  . Arthritis Mother   . Depression Mother   . Alcohol abuse Father   . Diabetes Father   . Kidney disease Brother   . Depression Maternal Grandmother    Family Psychiatric  History:  Social History:  Social History   Substance and Sexual Activity  Alcohol Use No     Social History   Substance and Sexual Activity   Drug Use No    Social History   Socioeconomic History  . Marital status: Married    Spouse name: Not on file  . Number of children: Not on file  . Years of education: Not on file  . Highest education level: Not on file  Occupational History  . Not on file  Tobacco Use  . Smoking status: Never Smoker  . Smokeless tobacco: Never Used  Substance and Sexual Activity  . Alcohol use: No  . Drug use: No  . Sexual activity: Not on file  Other Topics Concern  . Not on file  Social History Narrative  . Not on file   Social Determinants of Health   Financial Resource Strain: Not on file  Food Insecurity: Not on file  Transportation Needs: Not on file  Physical Activity: Not on file  Stress: Not on file  Social Connections: Not on file   Additional Social History:    Pain Medications: See MAR Prescriptions: See MAR Over the Counter: See MAR History of alcohol / drug use?: No history of alcohol / drug abuse (Pt denies.)                    Sleep: Fair  Appetite:  Fair  Current Medications: Current Facility-Administered Medications  Medication Dose Route Frequency Provider Last Rate Last Admin  . acetaminophen (TYLENOL) tablet 650 mg  650 mg Oral Q6H PRN Ethelene Hal, NP   650 mg at 08/17/20 1003  . alum &  mag hydroxide-simeth (MAALOX/MYLANTA) 200-200-20 MG/5ML suspension 30 mL  30 mL Oral Q4H PRN Ethelene Hal, NP      . Derrill Memo ON 08/19/2020] escitalopram (LEXAPRO) tablet 20 mg  20 mg Oral Daily [name], NP      . feeding supplement (ENSURE ENLIVE / ENSURE PLUS) liquid 237 mL  237 mL Oral BID BM [name], [name], MD   237 mL at 08/18/20 0820  . LORazepam (ATIVAN) tablet 0.5 mg  0.5 mg Oral Q6H PRN [name], MD   0.5 mg at 08/16/20 0110  . magnesium hydroxide (MILK OF MAGNESIA) suspension 30 mL  30 mL Oral Daily PRN Ethelene Hal, NP      . melatonin tablet 5 mg  5 mg Oral QHS [name], MD   5 mg at 08/17/20 2114  .  OLANZapine (ZYPREXA) tablet 20 mg  20 mg Oral QHS [name], MD   20 mg at 08/17/20 2113  . OLANZapine zydis (ZYPREXA) disintegrating tablet 5 mg  5 mg Oral Q8H PRN [name], MD      . propranolol (INDERAL) tablet 10 mg  10 mg Oral Q8H [name], MD   10 mg at 08/18/20 [phone]  . traZODone (DESYREL) tablet 50 mg  50 mg Oral QHS PRN [name], MD   50 mg at 08/16/20 2203    Lab Results: No results found for this or any previous visit (from the past 48 hour(s)).  Blood Alcohol level:  Lab Results  Component Value Date   ETH <10 96/06/5407    Metabolic Disorder Labs: Lab Results  Component Value Date   HGBA1C 6.2 (H) 08/08/2020   MPG 131.24 08/08/2020   Lab Results  Component Value Date   PROLACTIN 6.5 08/08/2020   Lab Results  Component Value Date   CHOL 219 (H) 08/12/2020   TRIG 133 08/12/2020   HDL 72 08/12/2020   CHOLHDL 3.0 08/12/2020   VLDL 27 08/12/2020   LDLCALC 120 (H) 08/12/2020   LDLCALC 129 (H) 08/08/2020    Physical Findings: AIMS: Facial and Oral Movements Muscles of Facial Expression: None, normal Lips and Perioral Area: None, normal Jaw: None, normal Tongue: None, normal,Extremity Movements Upper (arms, wrists, hands, fingers): None, normal Lower (legs, knees, ankles, toes): None, normal, Trunk Movements Neck, shoulders, hips: None, normal, Overall Severity Severity of abnormal movements (highest score from questions above): None, normal Incapacitation due to abnormal movements: None, normal Patient's awareness of abnormal movements (rate only patient's report): No Awareness, Dental Status Current problems with teeth and/or dentures?: No Does patient usually wear dentures?: No  CIWA:    COWS:     Musculoskeletal: Strength & Muscle Tone: within normal limits Gait & Station: normal Patient leans: N/A  Psychiatric Specialty Exam:  Presentation  General Appearance: Appropriate for Environment; Casual  Eye  Contact:Good  Speech:Normal Rate  Speech Volume:Normal  Handedness:Right   Mood and Affect  Mood:Anxious  Affect:Constricted   Thought Process  Thought Processes:Coherent  Descriptions of Associations:Tangential  Orientation:Full (Time, Place and Person)  Thought Content:Illogical; Rumination (Intermittently illogical; somatic focus)  History of Schizophrenia/Schizoaffective disorder:No  Duration of Psychotic Symptoms:Less than six months  Hallucinations:No data recorded  Ideas of Reference:None  Suicidal Thoughts:No data recorded  Homicidal Thoughts:No data recorded   Sensorium  Memory:Immediate Fair; Recent Fair; Remote Fair  Judgment:Fair  Insight:Fair   Executive Functions  Concentration:Fair  Attention Span:Fair  [place]  Language:Good   Psychomotor  Activity  Psychomotor Activity:No data recorded   Assets  Assets:Communication Skills; Desire for Improvement; Housing; Resilience; Social Support   Sleep  Sleep:No data recorded    Physical Exam: Physical Exam Vitals and nursing note reviewed.  Cardiovascular:     Rate and Rhythm: Normal rate and regular rhythm.  Neurological:     Mental Status: He is alert.  Psychiatric:        Attention and Perception: Attention normal.        Mood and Affect: Mood is anxious.        Speech: Speech normal.        Behavior: Behavior normal.        Thought Content: Thought content is paranoid.        Cognition and Memory: Memory is impaired.    ROS Blood pressure (!) 150/139, pulse 60, temperature 98.4 F (36.9 C), temperature source Oral, resp. rate 18, height 5\' 5"  (1.651 m), weight 60.3 kg, SpO2 100 %. Body mass index is 22.13 kg/m.   Treatment Plan Summary: Daily contact with patient to assess and evaluate symptoms and progress in treatment and Medication management    Continue with treatment plan on 08/18/2020 as listed below excepted where noted.  Major  depressive disorders with psychosis:  Continue Zyprexa 20 mg po nightly Increased Lexapro 10 mg to 20 mg  po daily Continue Inderal 10 mg po  BID Increased Trazodone 50 mg to 100 mg  po nightly     CSW to continue working on discharge disposition RN to follow-up with  in person translation in order for patient to  participate in daily group sessions   [name], NP 08/18/2020, 10:41 AM

## 2020-08-18 NOTE — BHH Counselor (Signed)
Clinical Social Work Note  CSW spoke with patient's wife Tally Due [phone] at her request to provide an update on her husband's care and progress.  She stated that there have been no interpreters this week so he has not been benefiting from groups.  CSW did call after this phone call to the interpretation office and left a message asking for an in-person interpreter on Monday 5/23 from 10am-4pm.    CSW shared that doctor mentioned this morning that patient may have some elements of obsessive/compulsive disorder, and she agreed that his thoughts are absolutely obsessive in nature.  CSW shared that it was decided his urine no longer needed to be collected.  When it was mentioned that patient was seen in the hall today and had said he was doing "so-so," she said that is all she has heard from him as well.  CSW mentioned that he was malodorous, and she said that at home she has needed to remind him to take showers, put on deodorant and put on clean clothing.  CSW agreed to ask the nurse staff to prompt these actions.  She asked about picking up his clothes to wash them, and was informed we can wash them here, but he will have to ask.  Wife is anxious to talk with Dr. Jeneen Rinks about patient's medicines.  Selmer Dominion, LCSW 08/18/2020, 11:16 AM

## 2020-08-18 NOTE — Progress Notes (Signed)
   08/17/20 2115  COVID-19 Daily Checkoff  Have you had a fever (temp > 37.80C/100F)  in the past 24 hours?  No  If you have had runny nose, nasal congestion, sneezing in the past 24 hours, has it worsened? No  COVID-19 EXPOSURE  Have you traveled outside the state in the past 14 days? No  Have you been in contact with someone with a confirmed diagnosis of COVID-19 or PUI in the past 14 days without wearing appropriate PPE? No  Have you been living in the same home as a person with confirmed diagnosis of COVID-19 or a PUI (household contact)? No  Have you been diagnosed with COVID-19? No

## 2020-08-18 NOTE — B[facility] Group Notes (Signed)
Psychoeducational Group Note  Date: 08-18-20 Time:  1300  Group Topic/Focus:  Making Healthy Choices:   The focus of this group is to help patients identify negative/unhealthy choices they were using prior to admission and identify positive/healthier coping strategies to replace them upon discharge.In this group, patients started asking about the brain and how the brain works with and how the chemicals work for those who use substances, the pros and cons of saboxone.  Participation Level:  listened  Participation Quality:  Appropriate  Affect:  Appropriate  Cognitive:  Oriented  Insight:  Improving  Engagement in Group:  Only left twice this group. Paid attention to what was being said  Additional Comments:. Rates his energy at a 4/10. Pt was able to sit through most of the group this afternoon. Did not share anything, but did pay attention to what was being said. Left the room twice. The last time he came back in to the room, this writer told him the group was over. He immediately turned to leave in a joking manner. This is the first time this writer has seen him smile.  Paulino Rily

## 2020-08-18 NOTE — Progress Notes (Signed)
   08/17/20 2115  Psych Admission Type (Psych Patients Only)  Admission Status Voluntary  Psychosocial Assessment  Patient Complaints Depression  Eye Contact Fair  Facial Expression Anxious;Worried  Affect Anxious;Preoccupied  Speech Logical/coherent;Language other than English  Interaction Minimal  Motor Activity Restless  Appearance/Hygiene Unremarkable  Behavior Characteristics Appropriate to situation  Mood Anxious;Pleasant  Aggressive Behavior  Effect No apparent injury  Thought Pension scheme manager thinking  Content Paranoia;Delusions  Delusions Somatic  Perception Hallucinations  Hallucination Visual  Judgment Poor  Confusion None  Danger to Self  Current suicidal ideation? Denies  Danger to Others  Danger to Others None reported or observed

## 2020-08-18 NOTE — B[facility] Group Notes (Signed)
Adult Psychoeducational Group Not Date:  08/18/2020 Time:  8502-7741 Group Topic/Focus: PROGRESSIVE RELAXATION. A group where deep breathing is taught and tensing and relaxation muscle groups is used. Imagery is used as well.  Pts are asked to imagine 3 pillars that hold them up when they are not able to hold themselves up.  Participation Level:  Active  Participation Quality:  Appropriate  Affect:  Appropriate  Cognitive:  Oriented  Insight: Improving  Engagement in Group:  Engaged  Modes of Intervention:  Activity, Discussion, Education, and Support  Additional Comments:  Rates his energy at an 8/10. States what holds him up is his family, wife and his daughter.  Darryl Wang

## 2020-08-18 NOTE — Progress Notes (Signed)
Adult Psychoeducational Group Note  Date:  08/18/2020 Time:  10:32 PM  Group Topic/Focus:  Wrap-Up Group:   The focus of this group is to help patients review their daily goal of treatment and discuss progress on daily workbooks.  Participation Level:  Minimal  Participation Quality:  Appropriate  Affect:  Appropriate  Cognitive:  Appropriate  Insight: Appropriate  Engagement in Group:  Limited  Modes of Intervention:  Support  Additional Comments:  Pt articulated that his goal for today was to focus on his treatment plan, and this goal was accomplished. Pt stated he did talk with staff about his care. Pt conveyed he made a phone call to his family and reported relationship with his family and support system was good. Pt reported he took all medications provided and attended all meal services. Pt said his appetite was good today. Pt evaluated his sleep last night as good. Pt verbalized he felt good about himself and rated his overall day an 8 out of 10 on this date. Pt articulated he had no physical pain. Pt denies no auditory or visual hallucinations or thoughts of harming himself or others. Pt said he would alert staff if anything changed. End of Wrap-Up Group progress report.      Trinna Post 08/18/2020, 10:32 PM

## 2020-08-18 NOTE — Progress Notes (Signed)
D. Pt presents as anxious, and continues to be fixated on his urination. Pt has been compliant with his medications, and has been attending groups today. Per pt's self inventory, pt rated his depression, hopelessness and anxiety all 4's. Pt reports that his goal is "to feel better about myself, and believe God has a plan." Pt currently denies SI/HI A. Labs and vitals monitored. Pt supported emotionally and encouraged to express concerns and ask questions.   R. Pt remains safe with 15 minute checks. Will continue POC.

## 2020-08-19 MED ORDER — HYDROXYZINE HCL 25 MG PO TABS
25.0000 mg | ORAL_TABLET | Freq: Four times a day (QID) | ORAL | Status: DC | PRN
  Administered 2020-08-20: 25 mg via ORAL
  Filled 2020-08-19: qty 1

## 2020-08-19 MED ORDER — CLONAZEPAM 0.5 MG PO TABS
0.5000 mg | ORAL_TABLET | Freq: Every evening | ORAL | Status: DC
  Administered 2020-08-19 – 2020-08-20 (×2): 0.5 mg via ORAL
  Filled 2020-08-19 (×2): qty 1

## 2020-08-19 NOTE — Progress Notes (Signed)
   08/18/20 2200  Psych Admission Type (Psych Patients Only)  Admission Status Voluntary  Psychosocial Assessment  Patient Complaints Worrying (pt obsessed with his bladder and urinating)  Eye Contact Fair  Facial Expression Anxious;Worried  Affect Anxious;Preoccupied  Speech Logical/coherent;Language other than English  Interaction Minimal  Motor Activity Restless  Appearance/Hygiene Unremarkable  Behavior Characteristics Appropriate to situation;Anxious;Restless;Fidgety  Mood Anxious;Preoccupied;Pleasant  Aggressive Behavior  Effect No apparent injury  Thought Pension scheme manager thinking  Content Preoccupation  Delusions Somatic  Perception Hallucinations  Hallucination None reported or observed  Judgment Poor  Confusion None  Danger to Self  Current suicidal ideation? Denies  Danger to Others  Danger to Others None reported or observed

## 2020-08-19 NOTE — Progress Notes (Signed)
Recreation Therapy Notes  Date:  5.23.22 Time: 0930 Location: [address] Dayroom  Group Topic: Stress Management  Goal Area(s) Addresses:  Patient will identify positive stress management techniques. Patient will identify benefits of using stress management post d/c.  Intervention: Stress Management  Activity:  Meditation.  LRT played a meditation that focused on calming anxiety.  Patients were to listen and follow along as meditation played to fully engage in activity.  Education:  Stress Management, Discharge Planning.   Education Outcome: Acknowledges Education  Clinical Observations/Feedback: Pt did not attend group session.   Victorino Sparrow, LRT/CTRS         Victorino Sparrow A 08/19/2020 12:11 PM

## 2020-08-19 NOTE — BHH Counselor (Signed)
CSW emailed interpretation services to arrange in person interpretor to come assist this patient in attending and participating in groups during this week.     Darletta Moll MSW, LCSW Clincal Social Worker  [facility]

## 2020-08-19 NOTE — Progress Notes (Signed)
   08/18/20 2200  COVID-19 Daily Checkoff  Have you had a fever (temp > 37.80C/100F)  in the past 24 hours?  No  If you have had runny nose, nasal congestion, sneezing in the past 24 hours, has it worsened? No  COVID-19 EXPOSURE  Have you traveled outside the state in the past 14 days? No  Have you been in contact with someone with a confirmed diagnosis of COVID-19 or PUI in the past 14 days without wearing appropriate PPE? No  Have you been living in the same home as a person with confirmed diagnosis of COVID-19 or a PUI (household contact)? No  Have you been diagnosed with COVID-19? No   

## 2020-08-19 NOTE — B[facility] Group Notes (Signed)
ADULT GRIEF GROUP NOTE:   Spiritual care group on grief and loss facilitated by chaplain Janne Napoleon, [facility]   Group Goal:   Support / Education around grief and loss   Members engage in facilitated group support and psycho-social education.   Group Description:   Following introductions and group rules, group members engaged in facilitated group dialog and support around topic of loss, with particular support around experiences of loss in their lives. Group Identified types of loss (relationships / self / things) and identified patterns, circumstances, and changes that precipitate losses. Reflected on thoughts / feelings around loss, normalized grief responses, and recognized variety in grief experience. Group noted Worden's four tasks of grief in discussion.   Group drew on Adlerian / Rogerian, narrative, MI,   Patient Progress: Darryl Wang attended group and was in and out of active listening.  At one point he was dozing, but he re-engaged later in the group.  He did not share anything verbally with the group.  [place], Bcc Pager, [phone] 4:45 PM

## 2020-08-19 NOTE — Progress Notes (Signed)
[facility] MD Progress Note  08/19/2020 6:01 PM Darryl Wang  MRN:  [id number] Reason for admission:Darryl Ortizis a 55 year old Spanish-speaking male with a history of anxiety and depressionadmittedfor worsening symptoms of depression, increased anxiety, paranoia, hallucinations, responding to internal stimuli, not sleeping, not eating, appearing out of touch with reality and suicidal ideation with a plan to jump in a lake.  Objective:Medical record reviewed. Patient's case discussed in detail with members of the treatment team. MD and medical student met with and evaluated the patient this morning for follow-up with the assistance of Spanish-speaking translator [phone] through translation app. Darryl Wang reports he's tired this morning, slept poorly last night, was tossing and turning a lot and pacing around and worried about urination throughout the night. He remains very worried about it taking longer to urinate at night. He states that once he starts, he has no issues with urination, and has no problems during the day. He states he's very scared something bad is going to happen and has a hard time being positive. "My body is making me choose an illness" and putting choices in front of him and saying he has to choose one--cancer, prostate issues, liver/kidney problems. He's unsure where these choices came from but states his body is telling his mind about them. "I don't think that way... I know those fears aren't real but I have a hard time separating things out." He denies any SI/HI. He states that he still sometimes hears "music" coming from his body but it's less frequent and more at night than during the day. The patient slept 4 hours last night.  Vitals today are 70F, pulse 75, respirations 16, BP 120/74. No new labs today. His intake/output is being monitored closely, he had 1812m of urine output yesterday.  Staff notes indicate that patient has been attending groups but participation is limited by language  barrier.  Attending MD met in person today with patient's wife and daughter with patient's permission with duration of the meeting lasting approximately 30 minutes.  Wife and daughter provided collateral information regarding patient's symptoms prior to admission.  MD provided education regarding symptoms, diagnosis, treatment, medications, anticipated treatment outcomes, etc.  Wife and daughter were provided opportunity to verbalize concerns and ask questions.  MD answered questions.  Wife and daughter verbalized understanding of information discussed and thanked MD for the meeting.  Principal Problem: MDD (major depressive disorder), recurrent, severe, with psychosis ([place] Diagnosis: Principal Problem:   MDD (major depressive disorder), recurrent, severe, with psychosis ([place] Active Problems:   Anxiety state  Total Time spent with patient: 25 minutes  Past Psychiatric History: see admission H&P  Past Medical History:  Past Medical History:  Diagnosis Date  . Depression    previously treated with zoloft (not helpful) and prozac (made him anxious). Was under care of male therapist, about 7 yrs ago.   History reviewed. No pertinent surgical history. Family History:  Family History  Problem Relation Age of Onset  . Arthritis Mother   . Depression Mother   . Alcohol abuse Father   . Diabetes Father   . Kidney disease Brother   . Depression Maternal Grandmother    Family Psychiatric  History: see admission H&P Social History:  Social History   Substance and Sexual Activity  Alcohol Use No     Social History   Substance and Sexual Activity  Drug Use No    Social History   Socioeconomic History  . Marital status: Married    Spouse name: Not on  file  . Number of children: Not on file  . Years of education: Not on file  . Highest education level: Not on file  Occupational History  . Not on file  Tobacco Use  . Smoking status: Never Smoker  . Smokeless tobacco: Never  Used  Substance and Sexual Activity  . Alcohol use: No  . Drug use: No  . Sexual activity: Not on file  Other Topics Concern  . Not on file  Social History Narrative  . Not on file   Social Determinants of Health   Financial Resource Strain: Not on file  Food Insecurity: Not on file  Transportation Needs: Not on file  Physical Activity: Not on file  Stress: Not on file  Social Connections: Not on file   Additional Social History:    Pain Medications: See MAR Prescriptions: See MAR Over the Counter: See MAR History of alcohol / drug use?: No history of alcohol / drug abuse (Pt denies.)                    Sleep: Poor  Appetite:  Good  Current Medications: Current Facility-Administered Medications  Medication Dose Route Frequency Provider Last Rate Last Admin  . acetaminophen (TYLENOL) tablet 650 mg  650 mg Oral Q6H PRN Ethelene Hal, NP   650 mg at 08/17/20 1003  . alum & mag hydroxide-simeth (MAALOX/MYLANTA) 200-200-20 MG/5ML suspension 30 mL  30 mL Oral Q4H PRN Ethelene Hal, NP      . clonazePAM [name]) tablet 0.5 mg  0.5 mg Oral QPM [name], MD      . escitalopram (LEXAPRO) tablet 20 mg  20 mg Oral Daily [name], NP   20 mg at 08/19/20 0751  . feeding supplement (ENSURE ENLIVE / ENSURE PLUS) liquid 237 mL  237 mL Oral BID BM Singleton, [name], MD   237 mL at 08/19/20 1342  . hydrOXYzine (ATARAX/VISTARIL) tablet 25 mg  25 mg Oral Q6H PRN [name], MD      . magnesium hydroxide (MILK OF MAGNESIA) suspension 30 mL  30 mL Oral Daily PRN Ethelene Hal, NP      . melatonin tablet 5 mg  5 mg Oral QHS [name], MD   5 mg at 08/18/20 2109  . OLANZapine (ZYPREXA) tablet 20 mg  20 mg Oral QHS [name], MD   20 mg at 08/18/20 2108  . OLANZapine zydis (ZYPREXA) disintegrating tablet 5 mg  5 mg Oral Q8H PRN [name], MD      . propranolol (INDERAL) tablet 10 mg  10 mg Oral Q8H [name], MD   10 mg at  08/19/20 1341  . traZODone (DESYREL) tablet 100 mg  100 mg Oral QHS PRN [name], NP   100 mg at 08/18/20 2109    Lab Results: No results found for this or any previous visit (from the past 48 hour(s)).  Blood Alcohol level:  Lab Results  Component Value Date   ETH <10 50/35/4656    Metabolic Disorder Labs: Lab Results  Component Value Date   HGBA1C 6.2 (H) 08/08/2020   MPG 131.24 08/08/2020   Lab Results  Component Value Date   PROLACTIN 6.5 08/08/2020   Lab Results  Component Value Date   CHOL 219 (H) 08/12/2020   TRIG 133 08/12/2020   HDL 72 08/12/2020   CHOLHDL 3.0 08/12/2020   VLDL 27 08/12/2020   LDLCALC 120 (  H) 08/12/2020   LDLCALC 129 (H) 08/08/2020    Physical Findings: AIMS: Facial and Oral Movements Muscles of Facial Expression: None, normal Lips and Perioral Area: None, normal Jaw: None, normal Tongue: None, normal,Extremity Movements Upper (arms, wrists, hands, fingers): None, normal Lower (legs, knees, ankles, toes): None, normal, Trunk Movements Neck, shoulders, hips: None, normal, Overall Severity Severity of abnormal movements (highest score from questions above): None, normal Incapacitation due to abnormal movements: None, normal Patient's awareness of abnormal movements (rate only patient's report): No Awareness, Dental Status Current problems with teeth and/or dentures?: No Does patient usually wear dentures?: No  CIWA:    COWS:     Musculoskeletal: Strength & Muscle Tone: within normal limits Gait & Station: normal Patient leans: N/A  Psychiatric Specialty Exam:  Presentation  General Appearance: Appropriate for Environment; Casual; Fairly Groomed  Eye Contact:Fair  Speech:Clear and Coherent; Normal Rate  Speech Volume:Normal  Handedness:Right   Mood and Affect  Mood:Anxious  Affect:Congruent   Thought Process  Thought Processes:Coherent; Goal Directed  Descriptions of Associations:Intact  Orientation:Full  (Time, Place and Person)  Thought Content:Rumination; Other (comment); Obsessions (Somatic focus)  History of Schizophrenia/Schizoaffective disorder:No  Duration of Psychotic Symptoms:Less than six months  Hallucinations:Hallucinations: Auditory Description of Auditory Hallucinations: hears "music" coming from his body and that his body is telling his mind to "choose" an illness or organ to have problems, unable to fully discern if these are coming in auditory form or just intrusive thoughts  Ideas of Reference:None  Suicidal Thoughts:Suicidal Thoughts: No  Homicidal Thoughts:Homicidal Thoughts: No   Sensorium  Memory:Immediate Good; Recent Good; Remote Good  Judgment:Fair  Insight:Shallow   Executive Functions  Concentration:Fair  Attention Span:Fair  [place]  Language:Good   Psychomotor Activity  Psychomotor Activity:Psychomotor Activity: Restlessness; Other (comment) (No observable tremor present)   Assets  Assets:Desire for Improvement; Housing; Resilience; Social Support   Sleep  Sleep:Sleep: Fair Number of Hours of Sleep: 4    Physical Exam: Physical Exam Vitals and nursing note reviewed.  Constitutional:      Appearance: Normal appearance.  HENT:     Head: Normocephalic.  Pulmonary:     Effort: Pulmonary effort is normal.  Musculoskeletal:     Cervical back: Normal range of motion.  Neurological:     Mental Status: He is alert.    Review of Systems  Genitourinary: Negative for dysuria, hematuria and urgency.  All other systems reviewed and are negative.  Blood pressure 114/69, pulse 76, temperature 98.2 F (36.8 C), temperature source Oral, resp. rate 16, height 5' 5"  (1.651 m), weight 60.3 kg, SpO2 99 %. Body mass index is 22.13 kg/m.   Treatment Plan Summary: Daily contact with patient to assess and evaluate symptoms and progress in treatment and Medication management  Continue every 15-minute  observation status, no roommate  Psychosis -ContinueOlanzapine 80m Q 1900.  -Continue olanzapine 519mPRN for breakthrough agitation  Depression -Continue escitalopram 2096maily.   Insomnia -Continue melatonin 5 mg Q 1900 -Continue trazodone 50 mg at bedtimePRNinsomnia  Anxiety -Continue Lexapro 20 mg daily. -Continue propranolol 10 mg every 8 hours -Start clonazepam 0.5mg70mery evening -Discontinue PRN lorazepam -Start hydroxyzine 25 mg 3 times daily PRN  Urinary symptoms ("trouble urinating", no dysuria, urgency, frequency today) -Likely 2/2 anxiety but continue to monitor for possible UTI  -UA normal -Is/Os ordered and normal today  Disposition planning in progress.  HeatMaxwell Mariondical Student MartEthelene Browns  I attest that I saw and  interviewed the patient and that I performed or reperformed the mental status examination of the patient. I reviewed the medical record. I have formulated the plan of treatment. I am in agreement with the exam, assessment and plan as documented above.  [name], MD 08/19/2020, 6:01 PM

## 2020-08-19 NOTE — [facility] Group Notes (Signed)
Occupational Therapy Group Note Date: 08/19/2020 Group Topic/Focus: Coping Skills  Group Description: Group encouraged increased engagement and participation through discussion and activity focused on Mental Health Awareness Month. May is Mental Health Awareness month and patients engaged in discussion surrounding the stigma surrounding mental health and feelings that they wanted to share/express. Patients were encouraged to engage in activity in which they created mental health ribbons that represented different diagnoses, symptoms, and things in which they would like to bring awareness too. Completed ribbons were then displayed around the unit for mental health awareness month.  Participation Level: Active   Participation Quality: Independent   Behavior: Calm and Cooperative   Speech/Thought Process: Directed   Affect/Mood: Anxious   Insight: Fair   Judgement: Fair   Individualization: Darryl Wang was active in their participation of group discussion/activity. Pt worked quietly and independently, and shared his ribbon during discussion. Pt shared his ribbon represents depression.   Modes of Intervention: Activity, Discussion and Education  Patient Response to Interventions:  Attentive, Engaged and Receptive   Plan: Continue to engage patient in OT groups 2 - 3x/week.  08/19/2020  Ponciano Ort, MOT, OTR/L

## 2020-08-19 NOTE — BH Assessment (Signed)
LCSW Group Therapy Note 08/19/2020 1:15pm  Type of Therapy and Topic:  Group Therapy:  Setting Goals  Participation Level:  Minimal  Description of Group: In this process group, patients discussed using strengths to work toward goals and address challenges.  Patients identified two positive things about themselves and one goal they were working on.  Patients were given the opportunity to share openly and support each other's plan for self-empowerment.  The group discussed the value of gratitude and were encouraged to have a daily reflection of positive characteristics or circumstances.  Patients were encouraged to identify a plan to utilize their strengths to work on current challenges and goals.  Therapeutic Goals 1. Patient will verbalize personal strengths/positive qualities and relate how these can assist with achieving desired personal goals 2. Patients will verbalize affirmation of peers plans for personal change and goal setting 3. Patients will explore the value of gratitude and positive focus as related to successful achievement of goals 4. Patients will verbalize a plan for regular reinforcement of personal positive qualities and circumstances.  Summary of Patient Progress: Pt came in late and did not speak during group.       Therapeutic Modalities Cognitive Behavioral Therapy Motivational Interviewing    Darryl Wang 08/19/2020 1:46 PM

## 2020-08-19 NOTE — Progress Notes (Signed)
   08/19/20 1338  Vital Signs  Temp 98.2 F (36.8 C)  Temp Source Oral  Pulse Rate 76  Pulse Rate Source Monitor  BP 114/69  BP Method Automatic  Oxygen Therapy  SpO2 99 %   D: Patient denies SI/HI/AVH. Patient rated anxiety 2/10 and depression 5/10. Pt. Out in open areas and attended group. According to the MHT, pt. Called wife and told her, he was going home. Pt.'s wife came to the lobby to pick up pt.  A:  Patient took scheduled medicine.  Support and encouragement provided Routine safety checks conducted every 15 minutes. Patient  Informed to notify staff with any concerns.  R:  Safety maintained.

## 2020-08-20 MED ORDER — MELATONIN 5 MG PO TABS
5.0000 mg | ORAL_TABLET | Freq: Every day | ORAL | Status: DC
  Administered 2020-08-20: 5 mg via ORAL
  Filled 2020-08-20 (×2): qty 1

## 2020-08-20 MED ORDER — TRAZODONE HCL 50 MG PO TABS
50.0000 mg | ORAL_TABLET | Freq: Every day | ORAL | Status: DC
  Administered 2020-08-20: 50 mg via ORAL
  Filled 2020-08-20 (×3): qty 1

## 2020-08-20 MED ORDER — CLONAZEPAM 0.25 MG PO TBDP: 0.2500 mg | ORAL_TABLET | Freq: Every day | ORAL | Status: DC | PRN

## 2020-08-20 MED ORDER — TRAZODONE HCL 100 MG PO TABS
100.0000 mg | ORAL_TABLET | Freq: Every evening | ORAL | Status: DC | PRN
  Administered 2020-08-21 – 2020-08-22 (×2): 100 mg via ORAL
  Filled 2020-08-20 (×3): qty 1

## 2020-08-20 MED ORDER — OLANZAPINE 10 MG PO TABS
20.0000 mg | ORAL_TABLET | Freq: Every day | ORAL | Status: DC
  Administered 2020-08-20: 20 mg via ORAL
  Filled 2020-08-20 (×2): qty 2

## 2020-08-20 NOTE — Progress Notes (Signed)
[facility] MD Progress Note  08/20/2020 5:17 PM Darryl Wang  MRN:  [id number]  Reason for admission:Darryl Ortizis a 55 year old Spanish-speaking male with a history of anxiety and depressionadmittedfor worsening symptoms of depression, increased anxiety, paranoia, hallucinations, responding to internal stimuli, not sleeping, not eating, appearing out of touch with reality and suicidal ideation with a plan to jump in a lake.  Objective:Medical record reviewed. Patient's case discussed in detail with members of the treatment team. MD and medical student met with and evaluated the patient this morning for follow-up with the assistance of native Spanish-speaking medical student. Darryl Wang remains tired today despite reported 4.35 hours of sleep per nursing, states he was still anxious about urinating last night. He looks very tired and has difficulty keeping his eyes open during the exam. He describes his mood as "better" today but was anxious this morning and received a PRN hydroxyzine shortly before our interview, which he acknowledges is making him more tired. He says he can urinate fine during the day and is excreting adequate amounts of urine per I/Os, but he remains concerned about not being able to urinate as much at night which repeatedly disrupts his sleep.  Patient reported that he doesn't have any specific fears about what may happen if he does not go to the bathroom prior to going to bed. Encouraged to still participate in groups as much as he can to stay active during the day rather than sleeping and to learn skills even if he has trouble communicating as well as he understands [place].  Discussed with Darryl Wang that treatment team is attempting to get translator that can be present with him during groups.  Nursing notes reviewed and indicate that he has been attending some groups and participating as much as he is able. Vital signs reviewed and stable from previous days during admission.   Principal  Problem: MDD (major depressive disorder), recurrent, severe, with psychosis ([place]) Diagnosis: Principal Problem:   MDD (major depressive disorder), recurrent, severe, with psychosis ([place]) Active Problems:   Anxiety state  Total Time spent with patient: 20 minutes  Past Psychiatric History: see admission H&P  Past Medical History:  Past Medical History:  Diagnosis Date  . Depression    previously treated with zoloft (not helpful) and prozac (made him anxious). Was under care of male therapist, about 7 yrs ago.   History reviewed. No pertinent surgical history. Family History:  Family History  Problem Relation Age of Onset  . Arthritis Mother   . Depression Mother   . Alcohol abuse Father   . Diabetes Father   . Kidney disease Brother   . Depression Maternal Grandmother    Family Psychiatric  History: see admission H&P Social History:  Social History   Substance and Sexual Activity  Alcohol Use No     Social History   Substance and Sexual Activity  Drug Use No    Social History   Socioeconomic History  . Marital status: Married    Spouse name: Not on file  . Number of children: Not on file  . Years of education: Not on file  . Highest education level: Not on file  Occupational History  . Not on file  Tobacco Use  . Smoking status: Never Smoker  . Smokeless tobacco: Never Used  Substance and Sexual Activity  . Alcohol use: No  . Drug use: No  . Sexual activity: Not on file  Other Topics Concern  . Not on file  Social History Narrative  .  Not on file   Social Determinants of Health   Financial Resource Strain: Not on file  Food Insecurity: Not on file  Transportation Needs: Not on file  Physical Activity: Not on file  Stress: Not on file  Social Connections: Not on file   Additional Social History:    Pain Medications: See MAR Prescriptions: See MAR Over the Counter: See MAR History of alcohol / drug use?: No history of alcohol / drug abuse (Pt  denies.)                    Sleep: Fair  Appetite:  Good  Current Medications: Current Facility-Administered Medications  Medication Dose Route Frequency Provider Last Rate Last Admin  . acetaminophen (TYLENOL) tablet 650 mg  650 mg Oral Q6H PRN Ethelene Hal, NP   650 mg at 08/19/20 2113  . alum & mag hydroxide-simeth (MAALOX/MYLANTA) 200-200-20 MG/5ML suspension 30 mL  30 mL Oral Q4H PRN Ethelene Hal, NP      . clonazePAM [name]) tablet 0.5 mg  0.5 mg Oral QPM [name], MD   0.5 mg at 08/19/20 1825  . escitalopram (LEXAPRO) tablet 20 mg  20 mg Oral Daily [name], NP   20 mg at 08/20/20 0945  . feeding supplement (ENSURE ENLIVE / ENSURE PLUS) liquid 237 mL  237 mL Oral BID BM [name], [name], MD   237 mL at 08/20/20 1432  . hydrOXYzine (ATARAX/VISTARIL) tablet 25 mg  25 mg Oral Q6H PRN [name], MD   25 mg at 08/20/20 0946  . magnesium hydroxide (MILK OF MAGNESIA) suspension 30 mL  30 mL Oral Daily PRN Ethelene Hal, NP      . melatonin tablet 5 mg  5 mg Oral QHS Ethelene Hal, NP      . OLANZapine [facility]) tablet 20 mg  20 mg Oral QHS Ethelene Hal, NP      . OLANZapine zydis (ZYPREXA) disintegrating tablet 5 mg  5 mg Oral Q8H PRN [name], MD      . propranolol (INDERAL) tablet 10 mg  10 mg Oral Q8H [name], MD   10 mg at 08/20/20 1431  . traZODone (DESYREL) tablet 100 mg  100 mg Oral QHS PRN [name], NP   100 mg at 08/19/20 2112    Lab Results: No results found for this or any previous visit (from the past 48 hour(s)).  Blood Alcohol level:  Lab Results  Component Value Date   ETH <10 53/29/9242    Metabolic Disorder Labs: Lab Results  Component Value Date   HGBA1C 6.2 (H) 08/08/2020   MPG 131.24 08/08/2020   Lab Results  Component Value Date   PROLACTIN 6.5 08/08/2020   Lab Results  Component Value Date   CHOL 219 (H) 08/12/2020   TRIG 133 08/12/2020   HDL 72  08/12/2020   CHOLHDL 3.0 08/12/2020   VLDL 27 08/12/2020   LDLCALC 120 (H) 08/12/2020   LDLCALC 129 (H) 08/08/2020    Physical Findings: AIMS: Facial and Oral Movements Muscles of Facial Expression: None, normal Lips and Perioral Area: None, normal Jaw: None, normal Tongue: None, normal,Extremity Movements Upper (arms, wrists, hands, fingers): None, normal Lower (legs, knees, ankles, toes): None, normal, Trunk Movements Neck, shoulders, hips: None, normal, Overall Severity Severity of abnormal movements (highest score from questions above): None, normal Incapacitation due to abnormal movements: None, normal Patient's awareness of abnormal movements (rate only  patient's report): No Awareness, Dental Status Current problems with teeth and/or dentures?: No Does patient usually wear dentures?: No  CIWA:    COWS:      Psychiatric Specialty Exam:  Presentation  General Appearance: Appropriate for Environment; Casual; Other (comment) (Appears tired)  Eye Contact:Fair  Speech:Normal Rate  Speech Volume:Normal  Handedness:Right   Mood and Affect  Mood:Anxious  Affect:Congruent   Thought Process  Thought Processes:Coherent  Descriptions of Associations:Tangential  Orientation:Full (Time, Place and Person)  Thought Content:Tangential; Other (comment) (Somatic focus)  History of Schizophrenia/Schizoaffective disorder:No  Duration of Psychotic Symptoms:Less than six months  Hallucinations:Hallucinations: Auditory Description of Auditory Hallucinations: hears "music" coming from his body and that his body is telling his mind to "choose" an illness or organ to have problems, unable to fully discern if these are coming in auditory form or just intrusive thoughts  Ideas of Reference:None  Suicidal Thoughts:Suicidal Thoughts: No  Homicidal Thoughts:Homicidal Thoughts: No   Sensorium  Memory:Immediate Good; Recent Good; Remote  Good  Judgment:Fair  Insight:Fair   Executive Functions  Concentration:Fair  Attention Span:Fair  [place]  Language:Good   Psychomotor Activity  Psychomotor Activity:Psychomotor Activity: Decreased; Other (comment) (Appears tired)   Assets  Assets:Desire for Improvement; Housing; Resilience; Social Support   Sleep  Sleep:Sleep: Fair Number of Hours of Sleep: 4.35    Physical Exam: Physical Exam Vitals and nursing note reviewed.  Constitutional:      Appearance: Normal appearance.  HENT:     Head: Normocephalic.  Pulmonary:     Effort: Pulmonary effort is normal.  Musculoskeletal:     Cervical back: Normal range of motion.  Neurological:     Mental Status: He is alert.  Psychiatric:     Comments: Very tired    Review of Systems  Genitourinary: Negative for dysuria, frequency and urgency.  Psychiatric/Behavioral: The patient is nervous/anxious.   All other systems reviewed and are negative.  Blood pressure 132/64, pulse 89, temperature 97.7 F (36.5 C), temperature source Oral, resp. rate 17, height 5' 5"  (1.651 m), weight 60.3 kg, SpO2 97 %. Body mass index is 22.13 kg/m.   Treatment Plan Summary: Daily contact with patient to assess and evaluate symptoms and progress in treatment and Medication management  Continue every 15-minute observation status, no roommate  Psychosis -ContinueOlanzapine 23m Q 1900.  -Continue olanzapine 557mPRN for breakthrough agitation  Depression -Continue escitalopram 2027maily.   Insomnia -Continuemelatonin 5 mg Q 1900 -Start trazodone 50 mg at bedtime standing dose -Continue trazodone 100 mg at bedtimePRNinsomnia  Anxiety -Continue Lexapro 20 mg daily. -Discontinuepropranolol 10 mg every 8 hours to reduce polypharmacy -Continue clonazepam 0.5mg4mery evening standing dose and 0.25 mg daily PRN as needed for anxiety -Discontinue PRN hydroxyzine due to daytime  drowsiness exacerbating current reversal of sleep-wake cycle  Urinary symptoms ("trouble urinating", no dysuria, urgency, frequency today) -Likely 2/2 anxiety  -UA normal -Is/Osordered and normal today  Disposition planning in progress.  HeatMaxwell Mariondical Student MartEthelene Browns  I attest that I saw and interviewed the patient and that I performed or reperformed the mental status examination of the patient. I reviewed the medical record. I have formulated the plan of treatment. I am in agreement with the exam, assessment and plan as documented above.  MartArthor Captain 08/20/2020, 5:17 PM

## 2020-08-20 NOTE — Progress Notes (Addendum)
   08/19/20 2030  Psych Admission Type (Psych Patients Only)  Admission Status Voluntary  Psychosocial Assessment  Patient Complaints Anxiety;Depression  Eye Contact Brief  Facial Expression Anxious;Worried  Affect Anxious;Preoccupied  Speech Logical/coherent;Language other than Primary school teacher Activity Restless;Pacing  Appearance/Hygiene Unremarkable  Behavior Characteristics Cooperative;Anxious;Pacing  Mood Depressed;Anxious;Preoccupied  Thought Pension scheme manager thinking  Content Preoccupation;Delusions  Delusions Somatic  Perception WDL  Hallucination None reported or observed  Judgment Poor  Confusion None  Danger to Self  Current suicidal ideation? Denies  Danger to Others  Danger to Others None reported or observed   Used [place] for translation. Pt denies SI but endorses HI and VH. Says he was homicidal towards three people today but now the details are fuzzy and he doesn't feel that anymore. Pt endorses VH of seeing people who watch him but don't talk to him. Pt denies pain but endorses anxiety 5/10 and depression 4/10. Pt still preoccupied with urination saying that he will get up in the night and have trouble starting a urine stream and then go to bed and get up again and then he can urinate. Pt given another measuring device in his toilet to measure urine. Pt told that measuring his urine will help him to see that he is eliminating enough fluid normally. Pt verbalized understanding. Pt also encouraged to stop drinking excess fluids in the evening so he doesn't feel the urge to urinate at night, leading to increased anxiety.

## 2020-08-20 NOTE — Progress Notes (Signed)
Recreation Therapy Notes  Animal-Assisted Activity (AAA) Program Checklist/Progress Notes Patient Eligibility Criteria Checklist & Daily Group note for Rec Tx Intervention  Date: 5.24.22 Time: 36 Location: [place]   AAA/T Program Assumption of Risk Form signed by Teacher, music or Parent Legal Guardian YES  Patient is free of allergies or severe asthma YES  Patient reports no fear of animals YES  Patient reports no history of cruelty to animals YES   Patient understands his/her participation is voluntary YES   Patient washes hands before animal contact YES   Patient washes hands after animal contact YES   Education: Contractor, Appropriate Animal Interaction   Education Outcome: Acknowledges understanding/In group clarification offered/Needs additional education.   Clinical Observations/Feedback: Pt did not attend activity.     Victorino Sparrow, LRT/CTRS         Victorino Sparrow A 08/20/2020 3:29 PM

## 2020-08-20 NOTE — B[facility] Group Notes (Signed)
Adult Psychoeducational Group Note  Date:  08/20/2020 Time:  5:05 PM  Group Topic/Focus:  Personal Choices and Values:   The focus of this group is to help patients assess and explore the importance of values in their lives, how their values affect their decisions, how they express their values and what opposes their expression.  Participation Level:  Minimal  Participation Quality:  Appropriate  Affect:  Appropriate  Cognitive:  Alert and Appropriate  Insight: Appropriate and Good  Engagement in Group:  Engaged  Modes of Intervention:  Discussion  Additional Comments:  Pt attended icebreaker activity with MHT this afternoon.   Darryl Wang Tameria Patti 08/20/2020, 5:05 PM

## 2020-08-20 NOTE — Progress Notes (Signed)
RN assisted pt with setting up the ipad so pt could make virutal telephone call where pt and family could interact via video and see one another on the screen.

## 2020-08-20 NOTE — Progress Notes (Signed)
RN used Engineer, petroleum to communicate with pt.  Pt denied SI/HI.  Pt Said he heard voices "where music came out of my body."  Pt said the music was not happy nor sad.  Pt continues to void in top hat that sits on toilet.  Pt has voided 1200 cc's so far this shift.  Pt took medications without difficulty.  Pt is resting at this time and appears to be in no distress.  RN will continue to monitor and provide support as needed.

## 2020-08-20 NOTE — B[facility] Group Notes (Signed)
Adult Psychoeducational Group Note  Date:  08/20/2020 Time:  9:04 AM  Group Topic/Focus:  Goals Group:   The focus of this group is to help patients establish daily goals to achieve during treatment and discuss how the patient can incorporate goal setting into their daily lives to aide in recovery.  Participation Level:  Did Not Attend  Additional Comments:  Pt did not attend morning goals group. Pt was given materials on healthy communication for the afternoon group.   [name] 08/20/2020, 9:04 AM

## 2020-08-21 DIAGNOSIS — F411 Generalized anxiety disorder: Secondary | ICD-10-CM | POA: Diagnosis not present

## 2020-08-21 DIAGNOSIS — R39198 Other difficulties with micturition: Secondary | ICD-10-CM | POA: Diagnosis not present

## 2020-08-21 DIAGNOSIS — F333 Major depressive disorder, recurrent, severe with psychotic symptoms: Secondary | ICD-10-CM | POA: Diagnosis not present

## 2020-08-21 MED ORDER — OLANZAPINE 2.5 MG PO TABS
2.5000 mg | ORAL_TABLET | Freq: Two times a day (BID) | ORAL | Status: DC
  Administered 2020-08-21 – 2020-08-22 (×3): 2.5 mg via ORAL
  Filled 2020-08-21 (×6): qty 1

## 2020-08-21 MED ORDER — TAMSULOSIN HCL 0.4 MG PO CAPS
0.4000 mg | ORAL_CAPSULE | Freq: Every day | ORAL | Status: DC
  Administered 2020-08-21 – 2020-08-28 (×7): 0.4 mg via ORAL
  Filled 2020-08-21 (×11): qty 1

## 2020-08-21 MED ORDER — OLANZAPINE 10 MG PO TABS
30.0000 mg | ORAL_TABLET | Freq: Every day | ORAL | Status: DC
  Administered 2020-08-21: 30 mg via ORAL
  Filled 2020-08-21 (×3): qty 3

## 2020-08-21 MED ORDER — CLONAZEPAM 0.5 MG PO TABS: 0.5000 mg | ORAL_TABLET | Freq: Every evening | ORAL | Status: DC | PRN

## 2020-08-21 NOTE — B[facility] Group Notes (Signed)
Adult Psychoeducational Group Note  Date:  08/21/2020 Time:  3:08 PM  Group Topic/Focus:  Overcoming Stress:   The focus of this group is to define stress and help patients assess their triggers.  Participation Level:  Did Not Attend Pt was asleep during this activity. Tyrell Antonio Allessandra Bernardi 08/21/2020, 3:08 PM

## 2020-08-21 NOTE — B[facility] Group Notes (Signed)
Adult Psychoeducational Group Note  Date:  08/21/2020 Time:  8:43 AM  Group Topic/Focus:  Goals Group:   The focus of this group is to help patients establish daily goals to achieve during treatment and discuss how the patient can incorporate goal setting into their daily lives to aide in recovery.  Participation Level:  Did Not Attend  Tyrell Antonio [facility] 08/21/2020, 8:43 AM

## 2020-08-21 NOTE — [facility] Group Notes (Signed)
Type of Therapy and Topic:  Group Therapy - Healthy vs Unhealthy Coping Skills  Participation Level:  Active   Description of Group The focus of this group was to determine what unhealthy coping techniques typically are used by group members and what healthy coping techniques would be helpful in coping with various problems. Patients were guided in becoming aware of the differences between healthy and unhealthy coping techniques. Patients were asked to identify 2-3 healthy coping skills they would like to learn to use more effectively.  Therapeutic Goals 1. Patients learned that coping is what human beings do all day long to deal with various situations in their lives 2. Patients defined and discussed healthy vs unhealthy coping techniques 3. Patients identified their preferred coping techniques and identified whether these were healthy or unhealthy 4. Patients determined 2-3 healthy coping skills they would like to become more familiar with and use more often. 5. Patients provided support and ideas to each other   Summary of Patient Progress:  During group, Julis did not share any supports or stressors but did write them down on the worksheet. Patient proved open to input from peers and feedback from [place]. Patient demonstrated insight into the subject matter, was respectful of peers, and participated throughout the entire session.

## 2020-08-21 NOTE — Progress Notes (Addendum)
   08/20/20 2100  Psych Admission Type (Psych Patients Only)  Admission Status Voluntary  Psychosocial Assessment  Patient Complaints Anxiety;Depression  Eye Contact Brief  Facial Expression Anxious;Worried  Affect Anxious;Preoccupied  Speech Logical/coherent;Language other than Surveyor, minerals Activity Restless;Pacing  Appearance/Hygiene Unremarkable  Behavior Characteristics Cooperative;Anxious  Mood Anxious;Depressed;Preoccupied  Thought Process  Coherency Concrete thinking  Content Preoccupation;Delusions  Delusions Somatic  Perception Hallucinations  Hallucination Visual (sees people in his head that he doesn't know)  Judgment Poor  Confusion None  Danger to Self  Current suicidal ideation? Denies  Danger to Others  Danger to Others None reported or observed (endorsed brief HI towards other people he doesn't know)   Spoke with pt using Interpreter services Simona Huh [id number]/Rebecca [phone] - Pt denies SI. Endorses slight HI towards other people that he doesn't know. Says he is seeing people who are only in his head. Endorses HA pain 5/10. Pt still preoccupied with urination no matter what information is given. Pt told that his urination amount is normal and he nods but still states anxiety over not being able to urinate at night. Pt urinated 900 ml last night.

## 2020-08-21 NOTE — Tx Team (Signed)
Interdisciplinary Treatment and Diagnostic Plan Update  08/21/2020 Time of Session: 9:35am Darryl Wang MRN: [id number]  Principal Diagnosis: MDD (major depressive disorder), recurrent, severe, with psychosis ([name])  Secondary Diagnoses: Principal Problem:   MDD (major depressive disorder), recurrent, severe, with psychosis ([name]) Active Problems:   Anxiety state   Current Medications:  Current Facility-Administered Medications  Medication Dose Route Frequency Provider Last Rate Last Admin  . acetaminophen (TYLENOL) tablet 650 mg  650 mg Oral Q6H PRN Ethelene Hal, NP   650 mg at 08/20/20 2104  . alum & mag hydroxide-simeth (MAALOX/MYLANTA) 200-200-20 MG/5ML suspension 30 mL  30 mL Oral Q4H PRN Ethelene Hal, NP      . clonazePAM [name]) disintegrating tablet 0.25 mg  0.25 mg Oral Daily PRN [name], MD      . clonazePAM [name]) tablet 0.5 mg  0.5 mg Oral QHS PRN [name], MD      . escitalopram (LEXAPRO) tablet 20 mg  20 mg Oral Daily [name], NP   20 mg at 08/21/20 0900  . feeding supplement (ENSURE ENLIVE / ENSURE PLUS) liquid 237 mL  237 mL Oral BID BM [name], [name], MD   237 mL at 08/21/20 1411  . magnesium hydroxide (MILK OF MAGNESIA) suspension 30 mL  30 mL Oral Daily PRN Ethelene Hal, NP      . OLANZapine [facility]) tablet 2.5 mg  2.5 mg Oral BID [name], MD   2.5 mg at 08/21/20 1145  . OLANZapine (ZYPREXA) tablet 30 mg  30 mg Oral QHS [name], MD      . tamsulosin [facility]) capsule 0.4 mg  0.4 mg Oral QPC breakfast [name], MD   0.4 mg at 08/21/20 1147  . traZODone (DESYREL) tablet 100 mg  100 mg Oral QHS PRN [name], MD       PTA Medications: Medications Prior to Admission  Medication Sig Dispense Refill Last Dose  . buPROPion (WELLBUTRIN XL) 150 MG 24 hr tablet Take 150 mg by mouth every morning.     . hydrOXYzine (ATARAX/VISTARIL) 25 MG tablet Take 1 tablet (25 mg total) by mouth  every 6 (six) hours as needed for anxiety or vomiting. (Patient taking differently: Take 25 mg by mouth 3 (three) times daily as needed for anxiety.) 30 tablet 0   . traZODone (DESYREL) 50 MG tablet Take 1 tablet (50 mg total) by mouth at bedtime and may repeat dose one time if needed. 15 tablet 0     Patient Stressors: Other: mental illness  Patient Strengths: Average or above average intelligence Capable of independent living Communication skills Physical Health Supportive family/friends Work skills  Treatment Modalities: Medication Management, Group therapy, Case management,  1 to 1 session with clinician, Psychoeducation, Recreational therapy.   Physician Treatment Plan for Primary Diagnosis: MDD (major depressive disorder), recurrent, severe, with psychosis ([place]) Long Term Goal(s): Improvement in symptoms so as ready for discharge Improvement in symptoms so as ready for discharge   Short Term Goals: Ability to identify changes in lifestyle to reduce recurrence of condition will improve Ability to verbalize feelings will improve Ability to disclose and discuss suicidal ideas Ability to identify and develop effective coping behaviors will improve Compliance with prescribed medications will improve Ability to identify triggers associated with substance abuse/mental health issues will improve Ability to identify changes in lifestyle to reduce recurrence of condition will improve Ability to verbalize feelings will improve Ability to disclose  and discuss suicidal ideas Ability to identify and develop effective coping behaviors will improve Compliance with prescribed medications will improve Ability to identify triggers associated with substance abuse/mental health issues will improve  Medication Management: Evaluate patient's response, side effects, and tolerance of medication regimen.  Therapeutic Interventions: 1 to 1 sessions, Unit Group sessions and Medication  administration.  Evaluation of Outcomes: Progressing  Physician Treatment Plan for Secondary Diagnosis: Principal Problem:   MDD (major depressive disorder), recurrent, severe, with psychosis ([name]) Active Problems:   Anxiety state  Long Term Goal(s): Improvement in symptoms so as ready for discharge Improvement in symptoms so as ready for discharge   Short Term Goals: Ability to identify changes in lifestyle to reduce recurrence of condition will improve Ability to verbalize feelings will improve Ability to disclose and discuss suicidal ideas Ability to identify and develop effective coping behaviors will improve Compliance with prescribed medications will improve Ability to identify triggers associated with substance abuse/mental health issues will improve Ability to identify changes in lifestyle to reduce recurrence of condition will improve Ability to verbalize feelings will improve Ability to disclose and discuss suicidal ideas Ability to identify and develop effective coping behaviors will improve Compliance with prescribed medications will improve Ability to identify triggers associated with substance abuse/mental health issues will improve     Medication Management: Evaluate patient's response, side effects, and tolerance of medication regimen.  Therapeutic Interventions: 1 to 1 sessions, Unit Group sessions and Medication administration.  Evaluation of Outcomes: Progressing   RN Treatment Plan for Primary Diagnosis: MDD (major depressive disorder), recurrent, severe, with psychosis ([name]) Long Term Goal(s): Knowledge of disease and therapeutic regimen to maintain health will improve  Short Term Goals: Ability to remain free from injury will improve, Ability to verbalize frustration and anger appropriately will improve, Ability to identify and develop effective coping behaviors will improve and Compliance with prescribed medications will improve  Medication Management: RN  will administer medications as ordered by provider, will assess and evaluate patient's response and provide education to patient for prescribed medication. RN will report any adverse and/or side effects to prescribing provider.  Therapeutic Interventions: 1 on 1 counseling sessions, Psychoeducation, Medication administration, Evaluate responses to treatment, Monitor vital signs and CBGs as ordered, Perform/monitor CIWA, COWS, AIMS and Fall Risk screenings as ordered, Perform wound care treatments as ordered.  Evaluation of Outcomes: Progressing   LCSW Treatment Plan for Primary Diagnosis: MDD (major depressive disorder), recurrent, severe, with psychosis ([place]) Long Term Goal(s): Safe transition to appropriate next level of care at discharge, Engage patient in therapeutic group addressing interpersonal concerns.  Short Term Goals: Engage patient in aftercare planning with referrals and resources, Increase social support, Increase ability to appropriately verbalize feelings, Increase emotional regulation, Identify triggers associated with mental health/substance abuse issues and Increase skills for wellness and recovery  Therapeutic Interventions: Assess for all discharge needs, 1 to 1 time with Social worker, Explore available resources and support systems, Assess for adequacy in community support network, Educate family and significant other(s) on suicide prevention, Complete Psychosocial Assessment, Interpersonal group therapy.  Evaluation of Outcomes: Progressing   Progress in Treatment: Attending groups: Yes. Participating in groups: Yes. Taking medication as prescribed: Yes. Toleration medication: Yes. Family/Significant other contact made: Yes, individual(s) contacted:  wife Patient understands diagnosis: No. Discussing patient identified problems/goals with staff: Yes. Medical problems stabilized or resolved: Yes. Denies suicidal/homicidal ideation: Yes. Issues/concerns per patient  self-inventory: No.   New problem(s) identified: No, Describe:  none  New Short Term/Long  Term Goal(s): medication stabilization, elimination of SI thoughts, development of comprehensive mental wellness plan.    Patient Goals:  Did not attend  Discharge Plan or Barriers: Is scheduled to receive medication management through [facility] and has an established therapist he will return to at discharge.  Reason for Continuation of Hospitalization: Delusions  Hallucinations Medication stabilization  Estimated Length of Stay: 3-5 days  Attendees: Patient: Did not attend  08/21/2020   Physician: Viann Fish, MD 08/21/2020   Nursing:  08/21/2020   RN Care Manager: 08/21/2020   Social Worker: Verdis Frederickson, [place] 08/21/2020   Recreational Therapist:  08/21/2020   Other:  08/21/2020   Other:  08/21/2020   Other: 08/21/2020      Scribe for Treatment Team: Darleen Crocker, [place] 08/21/2020 2:39 PM

## 2020-08-21 NOTE — Progress Notes (Signed)
Recreation Therapy Notes  Date: 5.25.22 Time: 0930 Location: [address] Dayroom  Group Topic: Stress Management   Goal Area(s) Addresses:  Patient will actively participate in stress management techniques presented during session.  Patient will successfully identify benefit of practicing stress management post d/c.   Intervention: Guided exercise with ambient sound and script  Activity :Guided Imagery  LRT read a script that focused enjoying the quiet calm of being at the beach listening to the waves.  Patients were to follow along as script was read and the sound of the waves playing in the background, patients were asked to visualize themselves in that space.  Patients were given suggestions of ways to access scripts post d/c and encouraged to explore Youtube and other apps available on smartphones, tablets, and computers.   Education:  Stress Management, Discharge Planning.   Education Outcome: Acknowledges education  Clinical Observations/Feedback: Patient did not attend group.    Victorino Sparrow, LRT/CTRS         Victorino Sparrow A 08/21/2020 12:20 PM

## 2020-08-21 NOTE — [facility] Counselor (Signed)
CSW spoke with Ms. Erle Guster (Daughter) [phone] who wanted to check and make sure that her father had an interpreter with him in each group.  CSW informed her that he did have an interpreter with him in the social work group today and that [place] would make sure to remind the instructors for the other groups that he needs to have a person or the machine with him for these groups.  Ms. Peddy also wanted an update about how her father was doing and was pleased to hear that he slept longer last night.  CSW provided Ms. Mollica with her direct number for any further concerns or questions.

## 2020-08-21 NOTE — Progress Notes (Signed)
Psychoeducational Group Note  Date:  08/21/2020 Time:  2055  Group Topic/Focus:  Wrap-Up Group:   The focus of this group is to help patients review their daily goal of treatment and discuss progress on daily workbooks.  Participation Level: Did Not Attend  Participation Quality:  Not Applicable  Affect:  Not Applicable  Cognitive:  Not Applicable  Insight:  Not Applicable  Engagement in Group: Not Applicable  Additional Comments:  The patient did not attend group this evening.   Archie Balboa S 08/21/2020, 8:55 PM

## 2020-08-21 NOTE — Progress Notes (Signed)
Pt presents with depressed mood, flat affect. He states he continues to feel anxious and depressed, he is withdrawn and seen with minimal interaction with his peers. He tells me he continues to have some auditory hallucinations and describes them as '' not complete words''. He denies any thoughts of hurting self. He is safe on the unit at this time, will con't to monitor.

## 2020-08-21 NOTE — Progress Notes (Addendum)
[facility] MD Progress Note  08/21/2020 12:14 PM Darryl Wang  MRN:  [id number] Reason for admission:Darryl Ortizis a 55 year old Spanish-speaking male with a history of anxiety and depressionadmittedfor worsening symptoms of depression, increased anxiety, paranoia, hallucinations, responding to internal stimuli, not sleeping, not eating, appearing out of touch with reality and suicidal ideation with a [place] to [place]. He is on hospital day  11.   Objective:Medical record reviewed. Patient's case discussed in detail with members of the treatment team.MD and medical student, Darryl Wang, met with and evaluated the patient this morning for follow-up with the assistance of native Spanish-speaking medical student. Darryl Wang is falling asleep during the interview, after only having slept 2 hours overnight. He continues to experience anxiety over difficulty urinating at night time. He states no difficulty during the day. He doesn't know what is waking him up at night time. He comments its mainly his anxiety keeping him awake after he awakens. He believes his anxiety was worse last night than other nights.  Patient admits that his wife complains of his snoring at night.  He does awaken with a dry mouth and sometimes has a morning headache.  We have instructed him to stay awake after lunch so that he can have a better sleep. Patient wants to stay up but it is very difficult especially since he doesn't speak the language and gets bored in the milieu, and is unable to participate in groups.He is aware that he will have an interpreter today from 1pm to 230pm to help in group session. He describes his mood as more calm/relax ("mas relajado"). He denies any SI/HI. States his goal is to control his anxiety and feel emotionally well before leaving.  Patient is asked about his work family life in regards to anxiety.  He states that he likes his work and feels respected.  He does work a day shift.  He admits that he has  gotten sleepy driving home, and will awaken when he hits rumble strips.  He denies having any anxiety related to work.  In regards to family life, reviewed that at intake there was some question about his wife separating from him.  He states that they have been married 27 years and will stay together.  They currently live home with their daughters ages 35 and 58 years as well as their 79 year old son.  He has a 38 year old son and 34 year old daughter who lives out of the house. Patient still mentions seeing people's faces in his head and hearing music coming from his stomach. The people he sees in his head are just people who he has said hi to or talked to shortly but are not anyone he knows. He doesn't feel threaten by the faces. He just wonders why that's happening to him. The music he hears from his stomach are old songs that he use to listen to and has neutral feelings towards.  He states, "the music from my stomach puts thoughts in my head and people that do not go away.  I do not understand why they do not believe."  Patient states that he continues to see new people and has continued to have these hallucinations today.  He is in agreement to increase his antipsychotics. Patient denies any other delusions. During the appointment we discussed his previous CT from 2008 where it was noted that he had an enlarged prostate. Due to the findings and his difficulty urinating we talked to [facility] about starting tamsulosin. Patient has no other concerns  at the moment and was encouraged to ask questions if needed.Nursing notes reviewed and indicate that he has been attending some groups and participating as much as he is able. Vital signs reviewed and stable: BP 131/70 (BP Location: Left Arm)   Pulse 88   Temp 98.7 F (37.1 C) (Oral)   Resp 17   Ht 5' 5" (1.651 m)   Wt 60.3 kg   SpO2 98%   BMI 22.13 kg/m     Principal Problem: MDD (major depressive disorder), recurrent, severe, with psychosis ([place]) Diagnosis:  Principal Problem:   MDD (major depressive disorder), recurrent, severe, with psychosis ([place]) Active Problems:   Anxiety state  Total Time spent with patient: 40 minutes  Past Psychiatric History: see admission H&P  Past Medical History:  Past Medical History:  Diagnosis Date  . Depression    previously treated with zoloft (not helpful) and prozac (made him anxious). Was under care of male therapist, about 7 yrs ago.   History reviewed. No pertinent surgical history. Family History:  Family History  Problem Relation Age of Onset  . Arthritis Mother   . Depression Mother   . Alcohol abuse Father   . Diabetes Father   . Kidney disease Brother   . Depression Maternal Grandmother    Family Psychiatric  History: Father with alcoholism  Social History:  Social History   Substance and Sexual Activity  Alcohol Use No     Social History   Substance and Sexual Activity  Drug Use No    Social History   Socioeconomic History  . Marital status: Married    Spouse name: Not on file  . Number of children: Not on file  . Years of education: Not on file  . Highest education level: Not on file  Occupational History  . Not on file  Tobacco Use  . Smoking status: Never Smoker  . Smokeless tobacco: Never Used  Substance and Sexual Activity  . Alcohol use: No  . Drug use: No  . Sexual activity: Not on file  Other Topics Concern  . Not on file  Social History Narrative  . Not on file   Social Determinants of Health   Financial Resource Strain: Not on file  Food Insecurity: Not on file  Transportation Needs: Not on file  Physical Activity: Not on file  Stress: Not on file  Social Connections: Not on file   Additional Social History:    Pain Medications: See MAR Prescriptions: See MAR Over the Counter: See MAR History of alcohol / drug use?: No history of alcohol / drug abuse (Pt denies.)                    Sleep: Poor  Appetite:  Good  Current  Medications: Current Facility-Administered Medications  Medication Dose Route Frequency Provider Last Rate Last Admin  . acetaminophen (TYLENOL) tablet 650 mg  650 mg Oral Q6H PRN Ethelene Hal, NP   650 mg at 08/20/20 2104  . alum & mag hydroxide-simeth (MAALOX/MYLANTA) 200-200-20 MG/5ML suspension 30 mL  30 mL Oral Q4H PRN Ethelene Hal, NP      . clonazePAM [name]) disintegrating tablet 0.25 mg  0.25 mg Oral Daily PRN [name], MD      . clonazePAM [name]) tablet 0.5 mg  0.5 mg Oral QHS PRN [name], MD      . escitalopram (LEXAPRO) tablet 20 mg  20 mg Oral Daily [name], NP  20 mg at 08/21/20 0900  . feeding supplement (ENSURE ENLIVE / ENSURE PLUS) liquid 237 mL  237 mL Oral BID BM [name], [name], MD   237 mL at 08/21/20 1004  . magnesium hydroxide (MILK OF MAGNESIA) suspension 30 mL  30 mL Oral Daily PRN Ethelene Hal, NP      . OLANZapine [facility]) tablet 2.5 mg  2.5 mg Oral BID [name], MD   2.5 mg at 08/21/20 1145  . OLANZapine (ZYPREXA) tablet 30 mg  30 mg Oral QHS [name], MD      . tamsulosin [facility]) capsule 0.4 mg  0.4 mg Oral QPC breakfast [name], MD   0.4 mg at 08/21/20 1147  . traZODone (DESYREL) tablet 100 mg  100 mg Oral QHS PRN [name], MD        Lab Results: No results found for this or any previous visit (from the past 48 hour(s)).  Blood Alcohol level:  Lab Results  Component Value Date   ETH <10 15/17/6160    Metabolic Disorder Labs: Lab Results  Component Value Date   HGBA1C 6.2 (H) 08/08/2020   MPG 131.24 08/08/2020   Lab Results  Component Value Date   PROLACTIN 6.5 08/08/2020   Lab Results  Component Value Date   CHOL 219 (H) 08/12/2020   TRIG 133 08/12/2020   HDL 72 08/12/2020   CHOLHDL 3.0 08/12/2020   VLDL 27 08/12/2020   LDLCALC 120 (H) 08/12/2020   LDLCALC 129 (H) 08/08/2020    Physical Findings: AIMS: Facial and Oral Movements Muscles of Facial  Expression: None, normal Lips and Perioral Area: None, normal Jaw: None, normal Tongue: None, normal,Extremity Movements Upper (arms, wrists, hands, fingers): None, normal Lower (legs, knees, ankles, toes): None, normal, Trunk Movements Neck, shoulders, hips: None, normal, Overall Severity Severity of abnormal movements (highest score from questions above): None, normal Incapacitation due to abnormal movements: None, normal Patient's awareness of abnormal movements (rate only patient's report): No Awareness, Dental Status Current problems with teeth and/or dentures?: No Does patient usually wear dentures?: No  CIWA:    COWS:     Musculoskeletal: Strength & Muscle Tone: within normal limits Gait & Station: normal Patient leans: N/A  Psychiatric Specialty Exam:  Presentation  General Appearance: Casual; Fairly Groomed  Eye Contact:Fair  Speech:Blocked; Normal Rate  Speech Volume:Normal  Handedness:Right   Mood and Affect  Mood:Anxious  Affect:Congruent   Thought Process  Thought Processes:Coherent  Descriptions of Associations:Circumstantial  Orientation:Full (Time, Place and Person)  Thought Content:Logical; Other (comment) (Somatic focus)  History of Schizophrenia/Schizoaffective disorder:No  Duration of Psychotic Symptoms:Less than six months  Hallucinations:Hallucinations: Auditory Description of Auditory Hallucinations: Old music he listened to  Ideas of Reference:None  Suicidal Thoughts:Suicidal Thoughts: No  Homicidal Thoughts:Homicidal Thoughts: No   Sensorium  Memory:Immediate Good; Recent Good; Remote Good  Judgment:Fair  Insight:Fair   Executive Functions  Concentration:Fair  Attention Span:Fair  [place]  Language:Good   Psychomotor Activity  Psychomotor Activity:Psychomotor Activity: Decreased (Falls asleep)   Assets  Assets:Desire for Improvement; Housing; Social Support   Sleep   Sleep:Sleep: Poor Number of Hours of Sleep: 1.5 Nurses document 2 hours overnight.   Physical Exam: Physical Exam Constitutional:      Appearance: Normal appearance.     Comments: Sleepy and dozing off during assessment  HENT:     Head: Normocephalic and atraumatic.     Nose: Nose normal.     Mouth/Throat:  Mouth: Mucous membranes are moist.  Cardiovascular:     Rate and Rhythm: Normal rate.  Pulmonary:     Effort: Pulmonary effort is normal.  Musculoskeletal:     Cervical back: Normal range of motion.  Neurological:     General: No focal deficit present.    Due to his difficulty falling asleep, staying asleep, and his increased need to urinate at night time we performed an Epworth sleepiness questionnaire. His results (15) are consistent with someone who is suffering from sleep apnea.  Patient has had episodes of falling asleep while driving and when stopped briefly in traffic.  EPWORTH SLEEPINESS SCALE  Sitting and reading 3  Watching TV 3  Sitting inactive in a public place (e.g. theater or meeting) 3  As a passenger in a car for an hour without a break 2  Lying down to rest in the afternoon when circumstances permit 2  Sitting and talking to someone 0  Sitting quietly after a lunch without alcohol 0  In a car, while stopped for a few minutes in traffic 2  Total  15  0 = no chance  1 = slight chance 2 = moderate chance 3 = high chance   Review of Systems  Constitutional: Positive for malaise/fatigue.  Respiratory: Negative.   Cardiovascular: Negative.   Gastrointestinal: Negative.   Genitourinary:       Difficulty urinating  Psychiatric/Behavioral: Positive for hallucinations. Negative for depression, substance abuse and suicidal ideas. The patient is nervous/anxious and has insomnia.    Blood pressure 119/82, pulse 71, temperature 97.7 F (36.5 C), temperature source Oral, resp. rate 17, height 5' 5" (1.651 m), weight 60.3 kg, SpO2 97 %. Body mass index  is 22.13 kg/m.   Treatment Plan Summary: Daily contact with patient to assess and evaluate symptoms and progress in treatment and Medication management  Continue every 15-minute observation status, no roommate  Psychosis -Increase  Olanzapine to 68m at bedtime on 08/21/2020 -Discontinue olanzapine 539mPRN for breakthrough agitation -Start scheduled Olanzapine 2.98m59mwice daily after breakfast and lunch for anxiety and persistent daytime hallucinations/delusions  Depression -Continue escitalopram20m88mily.   Insomnia -Discontinuemelatonin 5 mg at bedtime -Change trazodone  to 100 mg at bedtimePRNinsomnia  Anxiety -Continue Lexapro20 mg daily. -Change clonazepam0.98mge22my evening  as needed for anxiety and sleep -Discontinue clonazepam 0.25 mg daily PRN as needed for anxiety  Urinary symptoms ("trouble urinating", no dysuria, urgency, frequency today)  -UA normal -Is/Os normal today -Started tamsulosin 0.4 mg daily after breakfast for enlarged prostate  Disposition planning in progress.  Need to continue to obtain collateral and discharge planning with patient's wife and family.  Discussed with patient need for evaluation of sleep provider for suspicion of obstructive sleep apnea due to symptoms of daytime sleepiness even when sleeps well overnight.   CarloSharol Harnessdo, Medical Student contributed to this note.   08/21/2020, 12:14 PM   I have reviewed the note by medical student, and discussed the plan of care.  I have made amendments to the note.  I am in agreement with the assessment and plan.   SHEILLavella Hammock CarloLadonna Snideical Student 08/21/2020, 12:14 PM

## 2020-08-22 MED ORDER — TRAZODONE HCL 100 MG PO TABS: 100.0000 mg | ORAL_TABLET | Freq: Every day | ORAL | Status: DC

## 2020-08-22 MED ORDER — OLANZAPINE 10 MG PO TABS
20.0000 mg | ORAL_TABLET | Freq: Every day | ORAL | Status: DC
  Administered 2020-08-22: 20 mg via ORAL
  Filled 2020-08-22 (×2): qty 2

## 2020-08-22 MED ORDER — TRAZODONE HCL 100 MG PO TABS
100.0000 mg | ORAL_TABLET | Freq: Every evening | ORAL | Status: DC | PRN
  Administered 2020-08-22: 100 mg via ORAL
  Filled 2020-08-22: qty 1

## 2020-08-22 MED ORDER — CLONAZEPAM 0.25 MG PO TBDP
0.2500 mg | ORAL_TABLET | Freq: Two times a day (BID) | ORAL | Status: DC | PRN
  Administered 2020-08-24: 0.25 mg via ORAL
  Filled 2020-08-22: qty 1

## 2020-08-22 NOTE — Progress Notes (Signed)
[facility] MD Progress Note  08/22/2020 6:04 PM Darryl Wang  MRN:  [id number] Reason for admission:Darryl Ortizis a 55 year old Spanish-speaking male with a history of anxiety and depressionadmittedfor worsening symptoms of depression, increased anxiety, paranoia, hallucinations, responding to internal stimuli, not sleeping, not eating, appearing out of touch with reality and suicidal ideation with a plan [place]. He is on hospital day  12.   Objective:Medical record reviewed. Patient's case discussed in detail with members of the treatment team.MD and medical student met with Darryl Wang with the help of in-person interpreter from [facility]. Darryl Wang was in group with the interpreter prior to the interview.  The patient appears tired and sleepy during the interview.  He describes his mood as anxious and frustrated today. He clarifies that his mood is more anxious than sad and although he is tired there is some brief brightening today during our interaction which is the first brightening I have seen since he was admitted.  The patient reports that he continues to have difficulty sleeping at night due to repeated desire to urinate at night.  Overall his anxiety is decreased and he rates his anxiety at a 4/10 this morning with 10 being the worst.  Patient reports continued intermittent intrusive images of faces in his mind of people that he knows.  He also reports continued intermittent hearing music in his mind that reminds him of his childhood.  Neither the visual images of faces nor the sounds of music in his mind are clear hallucinations.  There are no other clear psychotic symptoms reported or signs present on exam today.   The patient slept 5 hours last night.   Vital signs reviewed and stable: BP 135/83 (BP Location: Left Arm)   Pulse 79   Temp 98.7 F (37.1 C) (Oral)   Resp 17   Ht 5' 5"  (1.651 m)   Wt 60.3 kg   SpO2 100%   BMI 22.13 kg/m    Principal Problem: MDD (major depressive  disorder), recurrent, severe, with psychosis ([place]) Diagnosis: Principal Problem:   MDD (major depressive disorder), recurrent, severe, with psychosis ([place]) Active Problems:   Anxiety state  Total Time spent with patient: 20 minutes  Past Psychiatric History: see admission H&P  Past Medical History:  Past Medical History:  Diagnosis Date  . Depression    previously treated with zoloft (not helpful) and prozac (made him anxious). Was under care of male therapist, about 7 yrs ago.   History reviewed. No pertinent surgical history. Family History:  Family History  Problem Relation Age of Onset  . Arthritis Mother   . Depression Mother   . Alcohol abuse Father   . Diabetes Father   . Kidney disease Brother   . Depression Maternal Grandmother    Family Psychiatric  History: Father with alcoholism  Social History:  Social History   Substance and Sexual Activity  Alcohol Use No     Social History   Substance and Sexual Activity  Drug Use No    Social History   Socioeconomic History  . Marital status: Married    Spouse name: Not on file  . Number of children: Not on file  . Years of education: Not on file  . Highest education level: Not on file  Occupational History  . Not on file  Tobacco Use  . Smoking status: Never Smoker  . Smokeless tobacco: Never Used  Substance and Sexual Activity  . Alcohol use: No  . Drug use: No  .  Sexual activity: Not on file  Other Topics Concern  . Not on file  Social History Narrative  . Not on file   Social Determinants of Health   Financial Resource Strain: Not on file  Food Insecurity: Not on file  Transportation Needs: Not on file  Physical Activity: Not on file  Stress: Not on file  Social Connections: Not on file   Additional Social History:    Pain Medications: See MAR Prescriptions: See MAR Over the Counter: See MAR History of alcohol / drug use?: No history of alcohol / drug abuse (Pt denies.)                     Sleep: Fair  Appetite:  Good  Current Medications: Current Facility-Administered Medications  Medication Dose Route Frequency Provider Last Rate Last Admin  . acetaminophen (TYLENOL) tablet 650 mg  650 mg Oral Q6H PRN Ethelene Hal, NP   650 mg at 08/20/20 2104  . alum & mag hydroxide-simeth (MAALOX/MYLANTA) 200-200-20 MG/5ML suspension 30 mL  30 mL Oral Q4H PRN Ethelene Hal, NP      . clonazePAM [facility]) disintegrating tablet 0.25 mg  0.25 mg Oral Q12H PRN [name], MD      . feeding supplement (ENSURE ENLIVE / ENSURE PLUS) liquid 237 mL  237 mL Oral BID BM [name], [name], MD   237 mL at 08/22/20 1622  . magnesium hydroxide (MILK OF MAGNESIA) suspension 30 mL  30 mL Oral Daily PRN Ethelene Hal, NP      . OLANZapine [facility]) tablet 20 mg  20 mg Oral QHS [name], MD      . tamsulosin [facility] capsule 0.4 mg  0.4 mg Oral QPC breakfast [name], MD   0.4 mg at 08/21/20 1147  . traZODone (DESYREL) tablet 100 mg  100 mg Oral QHS PRN [name], MD   100 mg at 08/21/20 2046    Lab Results: No results found for this or any previous visit (from the past 48 hour(s)).  Blood Alcohol level:  Lab Results  Component Value Date   ETH <10 62/37/6283    Metabolic Disorder Labs: Lab Results  Component Value Date   HGBA1C 6.2 (H) 08/08/2020   MPG 131.24 08/08/2020   Lab Results  Component Value Date   PROLACTIN 6.5 08/08/2020   Lab Results  Component Value Date   CHOL 219 (H) 08/12/2020   TRIG 133 08/12/2020   HDL 72 08/12/2020   CHOLHDL 3.0 08/12/2020   VLDL 27 08/12/2020   LDLCALC 120 (H) 08/12/2020   LDLCALC 129 (H) 08/08/2020    Physical Findings: AIMS: Facial and Oral Movements Muscles of Facial Expression: None, normal Lips and Perioral Area: None, normal Jaw: None, normal Tongue: None, normal,Extremity Movements Upper (arms, wrists, hands, fingers): None, normal Lower (legs, knees, ankles, toes): None,  normal, Trunk Movements Neck, shoulders, hips: None, normal, Overall Severity Severity of abnormal movements (highest score from questions above): None, normal Incapacitation due to abnormal movements: None, normal Patient's awareness of abnormal movements (rate only patient's report): No Awareness, Dental Status Current problems with teeth and/or dentures?: No Does patient usually wear dentures?: No  CIWA:    COWS:     Musculoskeletal: Strength & Muscle Tone: within normal limits Gait & Station: normal Patient leans: N/A  Psychiatric Specialty Exam:  Presentation  General Appearance: Appropriate for Environment (Appears tired)  Eye Contact:Fair  Speech:Clear and Coherent; Normal Rate  Speech  Volume:Normal  Handedness:Right   Mood and Affect  Mood:Anxious  Affect:Congruent; Other (comment) (Some brief appropriate brightening to certain topics)   Thought Process  Thought Processes:Coherent; Goal Directed  Descriptions of Associations:Intact  Orientation:Full (Time, Place and Person)  Thought Content:Logical; Rumination (Some rumination on somatic focus)  History of Schizophrenia/Schizoaffective disorder:No  Duration of Psychotic Symptoms:Less than six months  Hallucinations:Hallucinations: Other (comment) (Reports intermittently hearing music in his mind or seeing images of faces in his mind; no clear auditory or visual hallucinations) Description of Auditory Hallucinations: Unclear if "music" from his body and "faces" in his head are actual hallucinations or vivid memories  Ideas of Reference:None  Suicidal Thoughts:Suicidal Thoughts: No  Homicidal Thoughts:Homicidal Thoughts: No   Sensorium  Memory:Immediate Good; Recent Good; Remote Good  Judgment:Fair  Insight:Fair   Executive Functions  Concentration:Fair  Attention Span:Fair  [place]  Language:Good   Psychomotor Activity  Psychomotor Activity:Psychomotor  Activity: Normal   Assets  Assets:Desire for Improvement; Housing; Resilience; Social Support   Sleep  Sleep:Sleep: Fair Number of Hours of Sleep: Byron says he slept no better than previous nights.     Physical Exam: Physical Exam Vitals and nursing note reviewed.  Constitutional:      Appearance: Normal appearance.     Comments: Sleepy and dozing off during assessment  HENT:     Head: Normocephalic and atraumatic.     Nose: Nose normal.     Mouth/Throat:     Mouth: Mucous membranes are moist.  Cardiovascular:     Rate and Rhythm: Normal rate.  Pulmonary:     Effort: Pulmonary effort is normal.  Musculoskeletal:     Cervical back: Normal range of motion.  Neurological:     General: No focal deficit present.     Review of Systems  Constitutional: Positive for malaise/fatigue.  Respiratory: Negative.   Cardiovascular: Negative.   Gastrointestinal: Negative.   Genitourinary:       Difficulty urinating  Psychiatric/Behavioral: Positive for hallucinations. Negative for depression, substance abuse and suicidal ideas. The patient is nervous/anxious and has insomnia.   All other systems reviewed and are negative.  Blood pressure 135/83, pulse 79, temperature 98.7 F (37.1 C), temperature source Oral, resp. rate 17, height 5' 5"  (1.651 m), weight 60.3 kg, SpO2 100 %. Body mass index is 22.13 kg/m.   Treatment Plan Summary: Daily contact with patient to assess and evaluate symptoms and progress in treatment and Medication management  Continue every 15-minute observation status, no roommate  Psychosis -Decrease olanzapine to 57m at bedtime on 08/21/2020 -Discontinue scheduled Olanzapine 2.552mtwice daily after breakfast and lunch due to daytime fatigue  Depression -Hold escitalopram2018maily tomorrow morning. Anticipate possible change in antidepressant being used to treat depression and anxiety  Insomnia -Continue trazodone 100 mg at  bedtimePRNinsomnia  Anxiety -Hold escitalopram20m67mily tomorrow morning. Anticipate possible change in antidepressant being used to treat depression and anxiety -Continue clonazepam 0.25 mg q12 PRN as needed for anxiety  Urinary symptoms ("trouble urinating", no dysuria, urgency, frequency today)  -UA normal -Is/Os normal today -continue tamsulosin 0.4 mg daily after breakfast for enlarged prostate  Disposition planning in progress.  Need to continue to obtain collateral and discharge planning with patient's wife and family.   HeatMaxwell Mariondical Student MartEthelene Browns  I attest that I saw and interviewed the patient and that I performed or reperformed the mental status examination of the patient. I reviewed the medical record. I have formulated the  plan of treatment. I am in agreement with the exam, assessment and plan as documented above.  [name], MD 08/22/2020, 6:04 PM

## 2020-08-22 NOTE — Progress Notes (Signed)
DAR NOTE: Patient presents with anxious affect.  Denies pain, auditory and visual hallucinations. Reports having urinated well during the day and denied abd pain. Maintained on routine safety checks.  Medications given as prescribed.  Support and encouragement offered as needed.  Will continue to monitor.

## 2020-08-22 NOTE — Progress Notes (Signed)
   08/22/20 0625  Vital Signs  Pulse Rate 72  Pulse Rate Source Monitor  BP 140/89  BP Location Left Arm  BP Method Automatic  Patient Position (if appropriate) Sitting  Oxygen Therapy  SpO2 100 %   D: Patient denies SI/HI. Pt. Admits to seeing people.Pt. rated anxiety 3/10 and denied depression. A:  Patient took scheduled medicine.  Support and encouragement provided Routine safety checks conducted every 15 minutes. Patient  Informed to notify staff with any concerns.   R:  Safety maintained

## 2020-08-22 NOTE — Progress Notes (Signed)
   08/22/20 0625  Vital Signs  Pulse Rate 72  Pulse Rate Source Monitor  BP 140/89  BP Location Left Arm  BP Method Automatic  Patient Position (if appropriate) Sitting  Oxygen Therapy  SpO2 100 %   D: Patient denies SI/HI but admits to seeing people.Pt. rated anxiety 3/10 but denied depression. Pt. Had an interpreter today and went to group. Pt. Was on the phone  In the hall in the afternoon.  A:  Patient took scheduled medicine.  Support and encouragement provided Routine safety checks conducted every 15 minutes. Patient  Informed to notify staff with any concerns.   R: Safety maintained.

## 2020-08-22 NOTE — Progress Notes (Addendum)
   08/22/20 2100  Psych Admission Type (Psych Patients Only)  Admission Status Voluntary  Psychosocial Assessment  Patient Complaints Anxiety  Eye Contact Brief  Facial Expression Anxious;Worried  Affect Anxious;Preoccupied  Speech Logical/coherent;Language other than Physiological scientist Activity Restless;Pacing  Appearance/Hygiene Cytogeneticist Cooperative;Anxious;Restless  Mood Anxious;Preoccupied  Thought Process  Coherency Concrete thinking  Content Preoccupation;Delusions  Delusions Somatic  Perception Hallucinations  Hallucination Visual  Judgment Poor  Confusion None  Danger to Self  Current suicidal ideation? Denies  Danger to Others  Danger to Others None reported or observed   Used interpreter B[place] [phone]. Pt denies SI, HI, AH. Pt still says he sees people in his head. Pt rates depression 5/10. Pt still says he is anxious at night when it comes to urination. Pt says he slept a couple hours during the day. Pt still says he tries to go and then can't urinate so it wakes him up and he has to pace around the room until he can urinate and then he lays down. And, the cycle begins again. Plan with pt is to give him sleep medication at med time and then another dose a couple hours later to encourage more sleep.

## 2020-08-23 MED ORDER — QUETIAPINE FUMARATE 200 MG PO TABS
200.0000 mg | ORAL_TABLET | Freq: Every day | ORAL | Status: DC
  Administered 2020-08-23 – 2020-08-25 (×3): 200 mg via ORAL
  Filled 2020-08-23 (×5): qty 1

## 2020-08-23 MED ORDER — ESCITALOPRAM OXALATE 5 MG PO TABS
5.0000 mg | ORAL_TABLET | Freq: Every day | ORAL | Status: AC
  Administered 2020-08-23 – 2020-08-25 (×3): 5 mg via ORAL
  Filled 2020-08-23 (×3): qty 1

## 2020-08-23 MED ORDER — BUPROPION HCL ER (XL) 150 MG PO TB24
150.0000 mg | ORAL_TABLET | Freq: Every day | ORAL | Status: DC
  Administered 2020-08-23 – 2020-08-28 (×6): 150 mg via ORAL
  Filled 2020-08-23 (×8): qty 1

## 2020-08-23 MED ORDER — TRAZODONE HCL 50 MG PO TABS
50.0000 mg | ORAL_TABLET | Freq: Every evening | ORAL | Status: DC | PRN
  Administered 2020-08-24 – 2020-08-25 (×2): 50 mg via ORAL
  Filled 2020-08-23 (×2): qty 1

## 2020-08-23 NOTE — [facility] Group Notes (Signed)
Adult Psychoeducational Group Note  Date:  08/23/2020 Time:  10:27 AM  Group Topic/Focus:  Managing Feelings:   The focus of this group is to identify what feelings patients have difficulty handling and develop a plan to handle them in a healthier way upon discharge.  Participation Level:  Did Not Attend [facility] 08/23/2020, 10:27 AM

## 2020-08-23 NOTE — [facility] Group Notes (Signed)
Type of Therapy and Topic:  Group Therapy - Healthy vs Unhealthy Coping Skills  Participation Level:  Active   Description of Group The focus of this group was to determine what unhealthy coping techniques typically are used by group members and what healthy coping techniques would be helpful in coping with various problems. Patients were guided in becoming aware of the differences between healthy and unhealthy coping techniques. Patients were asked to identify 2-3 healthy coping skills they would like to learn to use more effectively.  Therapeutic Goals 1. Patients learned that coping is what human beings do all day long to deal with various situations in their lives 2. Patients defined and discussed healthy vs unhealthy coping techniques 3. Patients identified their preferred coping techniques and identified whether these were healthy or unhealthy 4. Patients determined 2-3 healthy coping skills they would like to become more familiar with and use more often. 5. 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 Patient proved open to input from peers and feedback from Circle. Patient demonstrated insight into the subject matter, was respectful of peers, and participated throughout the entire session.  Patient also accepted the worksheets and followed along with those worksheets during the discussion.

## 2020-08-23 NOTE — Progress Notes (Signed)
Pt assessment done via Spanish interpreter this shift. Per pt he had difficulty falling asleep due to being thirsty "My mouth was dry and I had to drink but once I fell asleep, I had a restful sleep" and being up urinating. Denies SI, HI, AVH and pain when assessed, forwards on interactions with fair eye contact "not right now" however, pt did state that he still sees images of people that he's seen in the past "Like if I met you before, we talk, I have those images of lot of faces of people. They are all around in my head". Denies any traumatic incident that will cause him to have flash backs when assessed. Reports some improvement in his mood "I'm starting to feel better with the treatment. I'm not so depressed and I just want to go back home to my family. I understand the importance of the treatment, my daughter is a Marine scientist at [employer] and she encourages me towards being cooperative". Pt attended groups this afternoon. Compliant with his medications on approach. Expressed looking forward to d/c to be with his family and back to his job "I've been at my job for about 10 years and they are still waiting for me to come back when I leave here". Emotional support and encouragement offered to pt throughout this shift. Safety checks maintained on and off unit without outburst. All medications given as ordered with verbal edcucation and effects monitored. Urine output thus far 1700 ml.  Pt tolerates all meals and medications well without discomfort. Denies concerns at this time. He remains safe on and off unit.

## 2020-08-23 NOTE — Progress Notes (Signed)
   08/23/20 2200  Psych Admission Type (Psych Patients Only)  Admission Status Voluntary  Psychosocial Assessment  Patient Complaints Anxiety  Eye Contact Fair  Facial Expression Anxious  Affect Anxious  Speech Logical/coherent;Language other than English  Interaction Assertive  Motor Activity Restless;Pacing  Appearance/Hygiene Unremarkable  Behavior Characteristics Cooperative  Mood Anxious  Thought Process  Coherency Concrete thinking  Content Preoccupation;Delusions  Delusions Somatic  Perception Hallucinations  Hallucination Visual  Judgment Limited  Confusion None  Danger to Self  Current suicidal ideation? Denies  Danger to Others  Danger to Others None reported or observed

## 2020-08-23 NOTE — [facility] Group Notes (Signed)
Adult Psychoeducational Group Note  Date:  08/23/2020 Time:  9:20 AM  Group Topic/Focus:  Goals Group:   The focus of this group is to help patients establish daily goals to achieve during treatment and discuss how the patient can incorporate goal setting into their daily lives to aide in recovery.  Participation Level:  Did Not Attend  Loney Loh 08/23/2020, 9:20 AM

## 2020-08-23 NOTE — [facility] Group Notes (Signed)
Adult Psychoeducational Group Note  Date:  08/23/2020 Time:  4:25 PM  Group Topic/Focus:  Emotional Education:   The focus of this group is to discuss what feelings/emotions are, and how they are experienced.  Participation Level:  Did Not Attend Darryl Wang 08/23/2020, 4:25 PM

## 2020-08-23 NOTE — Progress Notes (Signed)
[facility] MD Progress Note  08/23/2020 5:26 PM Darryl Wang  MRN:  [id number] Reason for admission:Darryl Ortizis a 55 year old Spanish-speaking male with a history of anxiety and depressionadmittedfor worsening symptoms of depression, increased anxiety, paranoia, hallucinations, responding to internal stimuli, not sleeping, not eating, appearing out of touch with reality and suicidal ideation with a plan to jump in a lake. He is on hospital day  13.    Objective:Medical record reviewed. Patient's case discussed in detail with members of the treatment team.MD and medical student met with Darryl Wang with the help of in-person interpreter from [facility]. Darryl Wang was sleeping prior to group after patients returned from the basement for severe weather. Overall Darryl Wang looks more alert today. He states he slept a little better last night (reported by nursing as 4.5 hours) and says his anxiety is a bit better, still rates it at a 4/10 with 10 being the worst. He slept ~1.5h yesterday after lunch. His mood today is "a little happy", and he says he's less tired than previous days. He enjoyed the groups yesterday and was able to get more information from them with the help of an interpreter, encouraged him to continue group attendance today as we have interpreters in person for multiple hours today. He had a video call with his family the last two evenings and has been enjoying these. Towards the end of the interview Darryl Wang got a bit more fidgety and restless, standing a few times and stating he was anxious before requesting to go to the bathroom. Intake/output reviewed with a net output of 1641m, indicating Darryl Wang still urinating well. Vital signs reviewed and stable: BP 135/83 (BP Location: Left Arm)   Pulse 79   Temp 98.7 F (37.1 C) (Oral)   Resp 17   Ht 5' 5"  (1.651 m)   Wt 60.3 kg   SpO2 100%   BMI 22.13 kg/m   Plan today to change multiple medications. Discontinuing Zyprexa given persistent daytime  sleepiness and adding 20348mSeroquel in the evenings. Counseled Darryl Wang possibility of orthostasis with this medication and encouraged him to sit up and stand up slowly tonight and going forward as a precaution. Also plan to taper Lexapro to 48m47mnd add Wellbutrin 150m54m help with anxiety and daytime somnolence. Will continue to monitor for progress with these medications, anticipate discharge early next week given slow improvements and anticipated improvement with outpatient support and follow up.    Principal Problem: MDD (major depressive disorder), recurrent, severe, with psychosis (HCC)Citrusagnosis: Principal Problem:   MDD (major depressive disorder), recurrent, severe, with psychosis (HCC)[place] Problems:   Anxiety state  Total Time spent with patient: 20 minutes  Past Psychiatric History: see admission H&P  Past Medical History:  Past Medical History:  Diagnosis Date  . Depression    previously treated with zoloft (not helpful) and prozac (made him anxious). Was under care of male therapist, about 7 yrs ago.   History reviewed. No pertinent surgical history. Family History:  Family History  Problem Relation Age of Onset  . Arthritis Mother   . Depression Mother   . Alcohol abuse Father   . Diabetes Father   . Kidney disease Brother   . Depression Maternal Grandmother    Family Psychiatric  History: Father with alcoholism  Social History:  Social History   Substance and Sexual Activity  Alcohol Use No     Social History   Substance and Sexual Activity  Drug Use No    Social  History   Socioeconomic History  . Marital status: Married    Spouse name: Not on file  . Number of children: Not on file  . Years of education: Not on file  . Highest education level: Not on file  Occupational History  . Not on file  Tobacco Use  . Smoking status: Never Smoker  . Smokeless tobacco: Never Used  Substance and Sexual Activity  . Alcohol use: No  . Drug use: No  .  Sexual activity: Not on file  Other Topics Concern  . Not on file  Social History Narrative  . Not on file   Social Determinants of Health   Financial Resource Strain: Not on file  Food Insecurity: Not on file  Transportation Needs: Not on file  Physical Activity: Not on file  Stress: Not on file  Social Connections: Not on file   Additional Social History:    Pain Medications: See MAR Prescriptions: See MAR Over the Counter: See MAR History of alcohol / drug use?: No history of alcohol / drug abuse (Pt denies.)                    Sleep: Fair  Appetite:  Good  Current Medications: Current Facility-Administered Medications  Medication Dose Route Frequency Provider Last Rate Last Admin  . acetaminophen (TYLENOL) tablet 650 mg  650 mg Oral Q6H PRN Ethelene Hal, NP   650 mg at 08/20/20 2104  . alum & mag hydroxide-simeth (MAALOX/MYLANTA) 200-200-20 MG/5ML suspension 30 mL  30 mL Oral Q4H PRN Ethelene Hal, NP      . buPROPion (WELLBUTRIN XL) 24 hr tablet 150 mg  150 mg Oral Daily [name], MD   150 mg at 08/23/20 0913  . clonazePAM (KLONOPIN) disintegrating tablet 0.25 mg  0.25 mg Oral Q12H PRN [name], MD      . escitalopram (LEXAPRO) tablet 5 mg  5 mg Oral Daily [name], MD   5 mg at 08/23/20 0913  . feeding supplement (ENSURE ENLIVE / ENSURE PLUS) liquid 237 mL  237 mL Oral BID BM [name], [name], MD   237 mL at 08/23/20 0914  . magnesium hydroxide (MILK OF MAGNESIA) suspension 30 mL  30 mL Oral Daily PRN Ethelene Hal, NP      . QUEtiapine (SEROQUEL) tablet 200 mg  200 mg Oral QHS [name], MD      . tamsulosin [facility]) capsule 0.4 mg  0.4 mg Oral QPC breakfast [name], MD   0.4 mg at 08/23/20 0913  . traZODone (DESYREL) tablet 50 mg  50 mg Oral QHS PRN [name], MD        Lab Results: No results found for this or any previous visit (from the past 48 hour(s)).  Blood Alcohol level:  Lab  Results  Component Value Date   ETH <10 93/81/0175    Metabolic Disorder Labs: Lab Results  Component Value Date   HGBA1C 6.2 (H) 08/08/2020   MPG 131.24 08/08/2020   Lab Results  Component Value Date   PROLACTIN 6.5 08/08/2020   Lab Results  Component Value Date   CHOL 219 (H) 08/12/2020   TRIG 133 08/12/2020   HDL 72 08/12/2020   CHOLHDL 3.0 08/12/2020   VLDL 27 08/12/2020   LDLCALC 120 (H) 08/12/2020   LDLCALC 129 (H) 08/08/2020    Physical Findings: AIMS: Facial and Oral Movements Muscles of Facial Expression: None, normal Lips and  Perioral Area: None, normal Jaw: None, normal Tongue: None, normal,Extremity Movements Upper (arms, wrists, hands, fingers): None, normal Lower (legs, knees, ankles, toes): None, normal, Trunk Movements Neck, shoulders, hips: None, normal, Overall Severity Severity of abnormal movements (highest score from questions above): None, normal Incapacitation due to abnormal movements: None, normal Patient's awareness of abnormal movements (rate only patient's report): No Awareness, Dental Status Current problems with teeth and/or dentures?: No Does patient usually wear dentures?: No  CIWA:    COWS:     Musculoskeletal: Strength & Muscle Tone: within normal limits Gait & Station: normal Patient leans: N/A  Psychiatric Specialty Exam:  Presentation  General Appearance: Appropriate for Environment; Casual; Well Groomed  Eye Contact:Good  Speech:Clear and Coherent; Normal Rate  Speech Volume:Normal  Handedness:Right   Mood and Affect  Mood:Anxious; Euthymic  Affect:Appropriate; Congruent   Thought Process  Thought Processes:Coherent; Goal Directed; Linear  Descriptions of Associations:Intact  Orientation:Full (Time, Place and Person)  Thought Content:Logical; WDL  History of Schizophrenia/Schizoaffective disorder:No  Duration of Psychotic Symptoms:Less than six months  Hallucinations:Hallucinations:  None Description of Auditory Hallucinations: Unclear if "music" from his body and "faces" in his head are actual hallucinations or vivid memories  Ideas of Reference:None  Suicidal Thoughts:Suicidal Thoughts: No  Homicidal Thoughts:Homicidal Thoughts: No   Sensorium  Memory:Immediate Good; Recent Good; Remote Good  Judgment:Good  Insight:Good   Executive Functions  Concentration:Good  Attention Span:Good  [place]  Language:Good   Psychomotor Activity  Psychomotor Activity:Psychomotor Activity: Normal   Assets  Assets:Housing; Data processing manager; Desire for Improvement; Communication Skills; Physical Health   Sleep  Sleep:Sleep: [place] Number of Hours of Sleep: 4.5 Gar says he slept a little better than previous nights.     Physical Exam: Physical Exam Vitals and nursing note reviewed.  Constitutional:      General: He is not in acute distress.    Appearance: Normal appearance.     Comments: Appears sleepy/tired  HENT:     Head: Normocephalic and atraumatic.  Pulmonary:     Effort: Pulmonary effort is normal.  Neurological:     General: No focal deficit present.     Review of Systems  Constitutional: Positive for malaise/fatigue.       Positive for fatigue  Respiratory: Negative.   Cardiovascular: Negative.   Gastrointestinal: Negative.   Genitourinary:       Difficulty urinating  Psychiatric/Behavioral: Positive for hallucinations. Negative for depression, substance abuse and suicidal ideas. The patient is nervous/anxious.   All other systems reviewed and are negative.  Blood pressure (!) 130/99, pulse 85, temperature 98 F (36.7 C), temperature source Oral, resp. rate 17, height 5' 5"  (1.651 m), weight 60.3 kg, SpO2 98 %. Body mass index is 22.13 kg/m.   Treatment Plan Summary: Daily contact with patient to assess and evaluate symptoms and progress in treatment and Medication management  Continue every 15-minute  observation status, no roommate  Psychosis  -Discontinue Olanzapine -Start Seroquel 246m in the evening for psychotic symptoms, mood symptoms and sleep  Depression & Anxiety  -Decrease Lexapro to 590mdaily with plan to discontinue -Start Wellbutrin 15036maily AM  -Continue clonazepam 0.25 mg q12 PRN as needed for anxiety  Insomnia -Continue trazodone 100 mg at bedtimePRNinsomnia  Urinary symptoms ("trouble urinating", no dysuria, urgency, frequency today)  -UA normal -Is/Os normal today -continue tamsulosin 0.4 mg daily after breakfast for enlarged prostate  Disposition planning in progress.  Need to continue to obtain collateral and discharge planning with patient's  wife and family. Anticipate discharge next week.   Maxwell Marion, Medical Student Ethelene Browns, MD  I attest that I saw and interviewed the patient and that I performed or reperformed the mental status examination of the patient. I reviewed the medical record. I have formulated the plan of treatment. I am in agreement with the exam, assessment and plan as documented above.  [name], MD 08/23/2020, 5:26 PM

## 2020-08-23 NOTE — Progress Notes (Signed)
Recreation Therapy Notes  Date: 5.27.22 Time: 0930 Location: [address] Dayroom  Group Topic: Stress Management  Goal Area(s) Addresses:  Patient will identify positive stress management techniques. Patient will identify benefits of using stress management post d/c.  Intervention: Stress Management  Activity: Meditation.  LRT played a meditation that focused on forgiving self.  Patients were to listen and follow along as the meditation played to fully engage in activity.    Education:  Stress Management, Discharge Planning.   Education Outcome: Acknowledges Education  Clinical Observations/Feedback: Pt did not attend group.    Victorino Sparrow, LRT/CTRS         Ria Comment, Jacquiline Zurcher A 08/23/2020 11:05 AM

## 2020-08-24 DIAGNOSIS — F333 Major depressive disorder, recurrent, severe with psychotic symptoms: Secondary | ICD-10-CM | POA: Diagnosis not present

## 2020-08-24 NOTE — Progress Notes (Addendum)
Pt visible in milieu, in scheduled groups, interacts with others minimally but appropriately due to language constraints (Spanish Speaking). Assessment done via an in person interpreter this shift. Pt received PRN Klonopin for anxiety as ordered at 1410 with stressor being "Not being able to get the voices and faces out of my head". He rates his anxiety and depression both 5/10. Per pt "I hear music from my stomach then it travels to my head with the voices telling me that I'm not going to do good. I'm just going to go home and relapsed on the same behavior. I keep telling the voices that's a lie. I'm not going to give in to them". Continues to endorse +VH of faces as well "The faces are still there". Denies SI, HI and pain when assessed. Compliant with medications when offered. Tolerates all meals, fluids and medications well without discomfort. Acknowledged that he slept better and an hour longer than Thursday night "I did because I tried my best to kick the thoughts out and drink less water in the evening; which makes me more anxious when I can't urinate at night". Emotional support and encouragement offered to pt. Safety checks maintained at Q 15 minutes intervals without self harm gestures or outburst to note thus far. All medications given with verbal education and effects monitored.  Pt remains safe on and off unit without disruptive behavior. Reports relief from anxiety with PRN Klonopin when reassessed at 1510.

## 2020-08-24 NOTE — Progress Notes (Signed)
   08/24/20 2000  Psych Admission Type (Psych Patients Only)  Admission Status Voluntary  Psychosocial Assessment  Patient Complaints Anxiety;Depression  Eye Contact Fair  Facial Expression Anxious;Worried  Affect Anxious;Preoccupied  Speech Logical/coherent;Language other than Physiological scientist Activity Restless;Fidgety  Appearance/Hygiene Unremarkable  Behavior Characteristics Cooperative;Anxious  Mood Depressed;Anxious;Pleasant  Thought Process  Coherency Concrete thinking  Content Preoccupation;Delusions  Delusions Somatic  Perception Hallucinations  Hallucination Auditory;Visual  Judgment Limited  Confusion None  Danger to Self  Current suicidal ideation? Denies  Danger to Others  Danger to Others None reported or observed   Using interpreter Hall Busing [phone]: Pt denies SI, HI and pain. Pt endorse AVH saying that he hears a voice that tells him he won't be able to stay on his medicine and that he won't be good at his work when he leaves. Pt says he counters this with positive self-talk and positive thoughts about his family and how he has been helped here with medicine and therapy. Pt has interpreter come for groups. Pt still sees people in his head.  Pt says he had a good day and spoke with his family. Pt rates anxiety 5/10 and depression 4/10. Urination was over 2000 ml today and so far is 500 ml since change of shift.

## 2020-08-24 NOTE — Progress Notes (Signed)
Patient attend wrap up group AA. 

## 2020-08-24 NOTE — Progress Notes (Addendum)
[facility] MD Progress Note  08/24/2020 12:21 PM Darryl Wang  MRN:  [id number] Reason for admission:Darryl Ortizis a 55 year old Spanish-speaking male with a history of anxiety and depressionadmittedfor worsening symptoms of depression, increased anxiety, paranoia, hallucinations, responding to internal stimuli, not sleeping, not eating, appearing out of touch with reality and suicidal ideation with a [place]. He is on hospital day  14.    Objective:Patient was seen with video interpreter Darryl Wang # [phone]. Chart reviewed and case discussed with the treatment team.  Darryl Wang was lying down after group this morning. He was easily aroused. Overall Darryl Wang looks more alert today. He states he slept a little better last night (reported by nursing as 5 hours) and says he feels a "bit better today."  His mood today is a "little better."  He is taking his medications and had no complaints of physical problems with the recent changes. He has no complaints about urination today.  Intake/output reviewed with a net output of 750 mL documented at 0600, indicating Darryl Wang is still urinating well. Vital signs reviewed and stable. Patient reported a good appetite. Vital signs reviewed: BP 138/98  Pulse 87. Patient was started on Wellbutrin XL 150 mg and Lexapro was decreased to 5 mg daily on 08/23/2020.  Patient stated he hears voices but then stated "I think it is just my conscience." He denies SI/HI, paranoia and delusions. He stated he sees people and places in his head but stated "I am just thinking." Patient is calm and cooperative. He is attending to his ADL's. Will continue to monitor, encouragement and support provided.   No new labs or medication changes today.   Principal Problem: MDD (major depressive disorder), recurrent, severe, with psychosis ([place]) Diagnosis: Principal Problem:   MDD (major depressive disorder), recurrent, severe, with psychosis ([place]) Active Problems:   Anxiety state  Total Time  spent with patient: 20 minutes  Past Psychiatric History: see admission H&P  Past Medical History:  Past Medical History:  Diagnosis Date  . Depression    previously treated with zoloft (not helpful) and prozac (made him anxious). Was under care of male therapist, about 7 yrs ago.   History reviewed. No pertinent surgical history. Family History:  Family History  Problem Relation Age of Onset  . Arthritis Mother   . Depression Mother   . Alcohol abuse Father   . Diabetes Father   . Kidney disease Brother   . Depression Maternal Grandmother    Family Psychiatric  History: Father with alcoholism  Social History:  Social History   Substance and Sexual Activity  Alcohol Use No     Social History   Substance and Sexual Activity  Drug Use No    Social History   Socioeconomic History  . Marital status: Married    Spouse name: Not on file  . Number of children: Not on file  . Years of education: Not on file  . Highest education level: Not on file  Occupational History  . Not on file  Tobacco Use  . Smoking status: Never Smoker  . Smokeless tobacco: Never Used  Substance and Sexual Activity  . Alcohol use: No  . Drug use: No  . Sexual activity: Not on file  Other Topics Concern  . Not on file  Social History Narrative  . Not on file   Social Determinants of Health   Financial Resource Strain: Not on file  Food Insecurity: Not on file  Transportation Needs: Not on file  Physical Activity: Not on file  Stress: Not on file  Social Connections: Not on file   Additional Social History:    Pain Medications: See MAR Prescriptions: See MAR Over the Counter: See MAR History of alcohol / drug use?: No history of alcohol / drug abuse (Pt denies.)     Sleep: Fair  Appetite:  Good  Current Medications: Current Facility-Administered Medications  Medication Dose Route Frequency Provider Last Rate Last Admin  . acetaminophen (TYLENOL) tablet 650 mg  650 mg Oral  Q6H PRN Ethelene Hal, NP   650 mg at 08/20/20 2104  . alum & mag hydroxide-simeth (MAALOX/MYLANTA) 200-200-20 MG/5ML suspension 30 mL  30 mL Oral Q4H PRN Ethelene Hal, NP      . buPROPion (WELLBUTRIN XL) 24 hr tablet 150 mg  150 mg Oral Daily [name], MD   150 mg at 08/24/20 0802  . clonazePAM (KLONOPIN) disintegrating tablet 0.25 mg  0.25 mg Oral Q12H PRN [name], MD      . escitalopram (LEXAPRO) tablet 5 mg  5 mg Oral Daily [name], MD   5 mg at 08/24/20 0802  . feeding supplement (ENSURE ENLIVE / ENSURE PLUS) liquid 237 mL  237 mL Oral BID BM [name], [name], MD   237 mL at 08/24/20 1045  . magnesium hydroxide (MILK OF MAGNESIA) suspension 30 mL  30 mL Oral Daily PRN Ethelene Hal, NP      . QUEtiapine (SEROQUEL) tablet 200 mg  200 mg Oral QHS [name], MD   200 mg at 08/23/20 2111  . tamsulosin (FLOMAX) capsule 0.4 mg  0.4 mg Oral QPC breakfast [name], MD   0.4 mg at 08/24/20 0802  . traZODone (DESYREL) tablet 50 mg  50 mg Oral QHS PRN [name], MD        Lab Results: No results found for this or any previous visit (from the past 48 hour(s)).  Blood Alcohol level:  Lab Results  Component Value Date   ETH <10 20/25/4270    Metabolic Disorder Labs: Lab Results  Component Value Date   HGBA1C 6.2 (H) 08/08/2020   MPG 131.24 08/08/2020   Lab Results  Component Value Date   PROLACTIN 6.5 08/08/2020   Lab Results  Component Value Date   CHOL 219 (H) 08/12/2020   TRIG 133 08/12/2020   HDL 72 08/12/2020   CHOLHDL 3.0 08/12/2020   VLDL 27 08/12/2020   LDLCALC 120 (H) 08/12/2020   LDLCALC 129 (H) 08/08/2020    Physical Findings: AIMS: Facial and Oral Movements Muscles of Facial Expression: None, normal Lips and Perioral Area: None, normal Jaw: None, normal Tongue: None, normal,Extremity Movements Upper (arms, wrists, hands, fingers): None, normal Lower (legs, knees, ankles, toes): None, normal, Trunk  Movements Neck, shoulders, hips: None, normal, Overall Severity Severity of abnormal movements (highest score from questions above): None, normal Incapacitation due to abnormal movements: None, normal Patient's awareness of abnormal movements (rate only patient's report): No Awareness, Dental Status Current problems with teeth and/or dentures?: No Does patient usually wear dentures?: No  CIWA:    COWS:     Musculoskeletal: Strength & Muscle Tone: within normal limits Gait & Station: normal Patient leans: N/A  Psychiatric Specialty Exam:  Presentation  General Appearance: Appropriate for Environment; Casual; Well Groomed  Eye Contact:Good  Speech:Clear and Coherent; Normal Rate  Speech Volume:Normal  Handedness:Right  Mood and Affect  Mood:Anxious; Euthymic  Affect:Appropriate; Congruent  Thought  Process  Thought Processes:Coherent; Goal Directed; Linear  Descriptions of Associations:Intact  Orientation:Full (Time, Place and Person)  Thought Content:Logical; WDL  History of Schizophrenia/Schizoaffective disorder:No  Duration of Psychotic Symptoms:Less than six months  Hallucinations:Hallucinations: None  Ideas of Reference:None  Suicidal Thoughts:Suicidal Thoughts: No  Homicidal Thoughts:Homicidal Thoughts: No  Sensorium  Memory:Immediate Good; Recent Good; Remote Good  Judgment:Good  Insight:Good  Executive Functions  Concentration:Good  Attention Span:Good  [place]  Language:Good  Psychomotor Activity  Psychomotor Activity:Psychomotor Activity: Normal  Assets  Assets:Housing; Data processing manager; Desire for Improvement; Communication Skills; Physical Health  Sleep  Sleep:Sleep: [place] Number of Hours of Sleep: [place] says he slept a little better than previous nights.    Physical Exam: Physical Exam Vitals and nursing note reviewed.  Constitutional:      General: He is not in acute distress.    Appearance:  Normal appearance.     Comments: Appears sleepy/tired  HENT:     Head: Normocephalic and atraumatic.  Pulmonary:     Effort: Pulmonary effort is normal.  Musculoskeletal:        General: Normal range of motion.     Cervical back: Normal range of motion.  Neurological:     General: No focal deficit present.     Mental Status: He is oriented to person, place, and time.    Review of Systems  Constitutional: Positive for malaise/fatigue.       Positive for fatigue  Respiratory: Negative.   Cardiovascular: Negative.   Gastrointestinal: Negative.   Genitourinary:       Difficulty urinating  Psychiatric/Behavioral: Positive for hallucinations. Negative for depression, substance abuse and suicidal ideas. The patient is nervous/anxious.   All other systems reviewed and are negative.  Blood pressure (!) 128/98, pulse 87, temperature 98.2 F (36.8 C), temperature source Oral, resp. rate 17, height 5\' 5"  (1.651 m), weight 60.3 kg, SpO2 98 %. Body mass index is 22.13 kg/m.   Treatment Plan Summary: Daily contact with patient to assess and evaluate symptoms and progress in treatment and Medication management  Continue every 15-minute observation status, no roommate  Psychosis  -Discontinue Olanzapine -Start Seroquel 200mg  in the evening for psychotic symptoms, mood symptoms and sleep  Depression & Anxiety  -Decrease Lexapro to 5mg  daily with plan to discontinue -Start Wellbutrin 150mg  daily AM  -Continue clonazepam 0.25 mg q12 PRN as needed for anxiety  Insomnia -Continue trazodone 100 mg at bedtimePRNinsomnia  Urinary symptoms ("trouble urinating", no dysuria, urgency, frequency today)  -UA normal -Is/Os normal today -continue tamsulosin 0.4 mg daily after breakfast for enlarged prostate  Disposition planning in progress.  Need to continue to obtain collateral and discharge planning with patient's wife and family. Anticipate discharge next week.    Ethelene Hal, NP 08/24/2020, 12:21 PM

## 2020-08-24 NOTE — [facility] Group Notes (Signed)
LCSW Group Therapy Note  08/24/2020   10:00-11:00am   Type of Therapy and Topic:  Group Therapy: Anger Cues and Responses  Participation Level:  Minimal   Description of Group:   In this group, patients learned how to recognize the physical, cognitive, emotional, and behavioral responses they have to anger-provoking situations.  They identified a recent time they became angry and how they reacted.  They analyzed how their reaction was possibly beneficial and how it was possibly unhelpful.  The group discussed a variety of healthier coping skills that could help with such a situation in the future.  Focus was placed on how helpful it is to recognize the underlying emotions to our anger, because working on those can lead to a more permanent solution as well as our ability to focus on the important rather than the urgent.  Therapeutic Goals: Patients will remember their last incident of anger and how they felt emotionally and physically, what their thoughts were at the time, and how they behaved. Patients will identify how their behavior at that time worked for them, as well as how it worked against them. Patients will explore possible new behaviors to use in future anger situations. Patients will learn that anger itself is normal and cannot be eliminated, and that healthier reactions can assist with resolving conflict rather than worsening situations.  Summary of Patient Progress:  The patient could participate only minimally due to language barrier.  He did share that when he gets angry he will often pull his hair or rub his head.  He was groggy and fell asleep several times, but he did stay in group for the entire time.  Therapeutic Modalities:   Cognitive Behavioral Therapy  Maretta Los

## 2020-08-24 NOTE — Progress Notes (Signed)
Patient states that he had a good day since he went outside and talked to his peers.

## 2020-08-25 DIAGNOSIS — F333 Major depressive disorder, recurrent, severe with psychotic symptoms: Secondary | ICD-10-CM | POA: Diagnosis not present

## 2020-08-25 NOTE — Progress Notes (Signed)
[facility] MD Progress Note  08/25/2020 11:19 AM Darryl Wang  MRN:  [id number] Reason for admission:Darryl Ortizis a 55 year old Spanish-speaking male with a history of anxiety and depressionadmittedfor worsening symptoms of depression, increased anxiety, paranoia, hallucinations, responding to internal stimuli, not sleeping, not eating, appearing out of touch with reality and suicidal ideation with a plan to [place]. He is on hospital day  15.    Objective:Patient was seen with video interpreter [facility] # [phone]. Chart reviewed and case discussed with the treatment team.  Darryl Wang was seen in his room after lunch today. Overall Darryl Wang looks more alert today. He states he slept a little better last night. He slept 6.75 hours. He stated he feels "better and calmer today." He stated he talked to his wife today for 15 minutes and the conversation went well. He did not endorse depression or anxiety today. He is taking his medications and had no complaints of physical problems with the recent changes. He received Trazodone PRN for sleep last night and received Klonopin 0.5 mg yesterday at 1410 for anxiety. He has no complaints about urination today. He is taking the Flomax and his output has been  Intake/output reviewed with a net output of 950 mL documented at 0745, indicating Darryl Wang is still urinating well. Vital signs reviewed and stable. Patient reported a good appetite.  Patient was started on Wellbutrin XL 150 mg and Lexapro was decreased to 5 mg daily on 08/23/2020.  Patient stated he hears voices which are negative but do not tell him to hurt himself. He stated when he is seen talking to himself, he is replacing negative thoughts with positive ones. He denied he is having thoughts of wanting to hurt himself or others.  He denies paranoia and delusions and does not appear to be responding to internal stimuli.  He stated he sees people and places in his head but stated "I am just thinking." Patient is calm and  cooperative. He is attending to his ADL's. Will continue to monitor, encouragement and support provided.   No new labs or medication changes today.   Principal Problem: MDD (major depressive disorder), recurrent, severe, with psychosis ([place]) Diagnosis: Principal Problem:   MDD (major depressive disorder), recurrent, severe, with psychosis ([place]) Active Problems:   Anxiety state  Total Time spent with patient: 20 minutes  Past Psychiatric History: see admission H&P  Past Medical History:  Past Medical History:  Diagnosis Date  . Depression    previously treated with zoloft (not helpful) and prozac (made him anxious). Was under care of male therapist, about 7 yrs ago.   History reviewed. No pertinent surgical history. Family History:  Family History  Problem Relation Age of Onset  . Arthritis Mother   . Depression Mother   . Alcohol abuse Father   . Diabetes Father   . Kidney disease Brother   . Depression Maternal Grandmother    Family Psychiatric  History: Father with alcoholism  Social History:  Social History   Substance and Sexual Activity  Alcohol Use No     Social History   Substance and Sexual Activity  Drug Use No    Social History   Socioeconomic History  . Marital status: Married    Spouse name: Not on file  . Number of children: Not on file  . Years of education: Not on file  . Highest education level: Not on file  Occupational History  . Not on file  Tobacco Use  . Smoking status: Never  Smoker  . Smokeless tobacco: Never Used  Substance and Sexual Activity  . Alcohol use: No  . Drug use: No  . Sexual activity: Not on file  Other Topics Concern  . Not on file  Social History Narrative  . Not on file   Social Determinants of Health   Financial Resource Strain: Not on file  Food Insecurity: Not on file  Transportation Needs: Not on file  Physical Activity: Not on file  Stress: Not on file  Social Connections: Not on file   Additional  Social History:    Pain Medications: See MAR Prescriptions: See MAR Over the Counter: See MAR History of alcohol / drug use?: No history of alcohol / drug abuse (Pt denies.)     Sleep: Fair  Appetite:  Good  Current Medications: Current Facility-Administered Medications  Medication Dose Route Frequency Provider Last Rate Last Admin  . acetaminophen (TYLENOL) tablet 650 mg  650 mg Oral Q6H PRN Ethelene Hal, NP   650 mg at 08/20/20 2104  . alum & mag hydroxide-simeth (MAALOX/MYLANTA) 200-200-20 MG/5ML suspension 30 mL  30 mL Oral Q4H PRN Ethelene Hal, NP      . buPROPion (WELLBUTRIN XL) 24 hr tablet 150 mg  150 mg Oral Daily [name], MD   150 mg at 08/25/20 0744  . clonazePAM (KLONOPIN) disintegrating tablet 0.25 mg  0.25 mg Oral Q12H PRN [name], MD   0.25 mg at 08/24/20 1410  . feeding supplement (ENSURE ENLIVE / ENSURE PLUS) liquid 237 mL  237 mL Oral BID BM [name], [name], MD   237 mL at 08/25/20 1006  . magnesium hydroxide (MILK OF MAGNESIA) suspension 30 mL  30 mL Oral Daily PRN Ethelene Hal, NP      . QUEtiapine (SEROQUEL) tablet 200 mg  200 mg Oral QHS [name], MD   200 mg at 08/24/20 2106  . tamsulosin (FLOMAX) capsule 0.4 mg  0.4 mg Oral QPC breakfast [name], MD   0.4 mg at 08/25/20 0744  . traZODone (DESYREL) tablet 50 mg  50 mg Oral QHS PRN [name], MD   50 mg at 08/24/20 2106    Lab Results: No results found for this or any previous visit (from the past 74 hour(s)).  Blood Alcohol level:  Lab Results  Component Value Date   ETH <10 98/33/8250    Metabolic Disorder Labs: Lab Results  Component Value Date   HGBA1C 6.2 (H) 08/08/2020   MPG 131.24 08/08/2020   Lab Results  Component Value Date   PROLACTIN 6.5 08/08/2020   Lab Results  Component Value Date   CHOL 219 (H) 08/12/2020   TRIG 133 08/12/2020   HDL 72 08/12/2020   CHOLHDL 3.0 08/12/2020   VLDL 27 08/12/2020   LDLCALC 120 (H)  08/12/2020   LDLCALC 129 (H) 08/08/2020    Physical Findings: AIMS: Facial and Oral Movements Muscles of Facial Expression: None, normal Lips and Perioral Area: None, normal Jaw: None, normal Tongue: None, normal,Extremity Movements Upper (arms, wrists, hands, fingers): None, normal Lower (legs, knees, ankles, toes): None, normal, Trunk Movements Neck, shoulders, hips: None, normal, Overall Severity Severity of abnormal movements (highest score from questions above): None, normal Incapacitation due to abnormal movements: None, normal Patient's awareness of abnormal movements (rate only patient's report): No Awareness, Dental Status Current problems with teeth and/or dentures?: No Does patient usually wear dentures?: No  CIWA:    COWS:  Musculoskeletal: Strength & Muscle Tone: within normal limits Gait & Station: normal Patient leans: N/A  Psychiatric Specialty Exam:  Presentation  General Appearance: Appropriate for Environment; Casual; Well Groomed  Eye Contact:Good  Speech:Clear and Coherent; Normal Rate  Speech Volume:Normal  Handedness:Right  Mood and Affect  Mood:Anxious; Euthymic  Affect:Appropriate; Congruent  Thought Process  Thought Processes:Coherent; Goal Directed; Linear  Descriptions of Associations:Intact  Orientation:Full (Time, Place and Person)  Thought Content:Logical; WDL  History of Schizophrenia/Schizoaffective disorder:No  Duration of Psychotic Symptoms:Less than six months  Hallucinations:No data recorded  Ideas of Reference:None  Suicidal Thoughts:No data recorded  Homicidal Thoughts:No data recorded  Sensorium  Memory:Immediate Good; Recent Good; Remote Good  Judgment:Good  Insight:Good  Executive Functions  Concentration:Good  Attention Span:Good  [place]  Language:Good  Psychomotor Activity  Psychomotor Activity:No data recorded  Assets  Assets:Housing; Data processing manager;  Desire for Improvement; Communication Skills; Physical Health  Sleep  Sleep:Number of Hours of Sleep: [place] says he slept a little better than previous nights.    Physical Exam: Physical Exam Vitals and nursing note reviewed.  Constitutional:      General: He is not in acute distress.    Appearance: Normal appearance.     Comments: Appears sleepy/tired  HENT:     Head: Normocephalic and atraumatic.  Pulmonary:     Effort: Pulmonary effort is normal.  Musculoskeletal:        General: Normal range of motion.     Cervical back: Normal range of motion.  Neurological:     General: No focal deficit present.     Mental Status: He is oriented to person, place, and time.    Review of Systems  Constitutional: Positive for malaise/fatigue.       Positive for fatigue  Respiratory: Negative.   Cardiovascular: Negative.   Gastrointestinal: Negative.   Genitourinary:       Difficulty urinating  Psychiatric/Behavioral: Positive for hallucinations. Negative for depression, substance abuse and suicidal ideas. The patient is nervous/anxious.   All other systems reviewed and are negative.  Blood pressure 140/87, pulse 87, temperature 98.4 F (36.9 C), temperature source Oral, resp. rate 17, height 5\' 5"  (1.651 m), weight 60.3 kg, SpO2 99 %. Body mass index is 22.13 kg/m.   Treatment Plan Summary: Daily contact with patient to assess and evaluate symptoms and progress in treatment and Medication management  Continue every 15-minute observation status, no roommate  Psychosis  -Discontinue Olanzapine -Start Seroquel 200mg  in the evening for psychotic symptoms, mood symptoms and sleep  Depression & Anxiety  -Decrease Lexapro to 5mg  daily with plan to discontinue -Start Wellbutrin 150mg  daily AM  -Continue clonazepam 0.25 mg q12 PRN as needed for anxiety  Insomnia -Continue trazodone 100 mg at bedtimePRNinsomnia  Urinary symptoms ("trouble urinating", no dysuria, urgency,  frequency today)  -UA normal -Is/Os normal today -continue tamsulosin 0.4 mg daily after breakfast for enlarged prostate  Disposition planning in progress.  Need to continue to obtain collateral and discharge planning with patient's wife and family. Anticipate discharge next week.    Ethelene Hal, NP 08/25/2020, 11:19 AM

## 2020-08-25 NOTE — B[facility] Group Notes (Signed)
Goal Group  Translator Present   Patient rated anxiety and depression 5/10.  Patient stated that he has been practicing using positive thoughts when hearing and seeing things.

## 2020-08-25 NOTE — Progress Notes (Signed)
RN communicated with pt via the interpreter who was on the unit to assist pt with going to programming.  Pt denies SI/HI.  Endorses Auditory and visual hallucinations..."I see faces around my head and hear them speaking to me."  Pt's mood has improved and he denies feeling depressed.  RN observed pt smiling when pt was talking to the interpreter.  RN assessed for needs and concerns and provided support.  Pt remains safe on the unit with q 15 min checks in place.

## 2020-08-25 NOTE — Progress Notes (Signed)
The focus of this group is to help patients review their daily goal of treatment and discuss progress on daily workbooks. Pt attended the evening group session and responded to all discussion prompts from the [place]. Pt shared that today was a generally good day on the unit and that he felt happy.  Pt told that his goal for the coming week was to "get better" and that he felt he needed more time in the hospital.  Pt rated his day an 8 out of 10 and his affect was appropriate.

## 2020-08-25 NOTE — [facility] Group Notes (Signed)
LCSW Group Therapy Note  08/25/2020       Type of Therapy and Topic:  Group Therapy: "My Mental Health"  Participation Level:  None   Description of Group:   In this group, patients were asked four questions in order to generate discussion around the idea of mental illness and/or substance addiction being medical problems: In one sentence describe the current state of your mental health or substance use. How much do you feel similar to or different from others? Do you tend to identify with other people or compare yourself to them?  In a word or sentence, share what you desire your mental health or substance use to be moving forward.  Discussion was held that led to the conclusion that comparing ourselves to others is not healthy, but identifying with the elements of their issues that are similar to ours is helpful.    Therapeutic Goals: Patients will identify their feelings about their current mental health/substance use problems. Patients will describe how they feel similar to or different from others, and whether they tend to identify with or compare themselves to other people with the same issues. Patients will explore the differences in these concepts and how a change of mindset about mental health/substance use can help with reaching recovery goals. Patients will think about and share what their recovery goals are, in terms of mental health and/or substance use.  Summary of Patient Progress:  The patient did not have an interpreter, and he sat through group although he understood little and could not share.  Therapeutic Modalities:   Processing Motivational Interviewing Psychoeducation  Maretta Los, MSW, LCSW

## 2020-08-26 MED ORDER — TRAZODONE HCL 50 MG PO TABS
50.0000 mg | ORAL_TABLET | Freq: Every evening | ORAL | Status: DC | PRN
  Administered 2020-08-26: 50 mg via ORAL
  Filled 2020-08-26: qty 1

## 2020-08-26 MED ORDER — QUETIAPINE FUMARATE 300 MG PO TABS
300.0000 mg | ORAL_TABLET | Freq: Every day | ORAL | Status: DC
  Administered 2020-08-26 – 2020-08-27 (×2): 300 mg via ORAL
  Filled 2020-08-26 (×4): qty 1

## 2020-08-26 NOTE — Progress Notes (Signed)
Pt was able to attend this morning's goal group and stated that his goal was to continue to get better so he could be able to go home.

## 2020-08-26 NOTE — Tx Team (Signed)
Interdisciplinary Treatment and Diagnostic Plan Update  08/26/2020 Time of Session: 9:35am Darryl Wang MRN: [id number]  Principal Diagnosis: MDD (major depressive disorder), recurrent, severe, with psychosis ([place])  Secondary Diagnoses: Principal Problem:   MDD (major depressive disorder), recurrent, severe, with psychosis ([place]) Active Problems:   Anxiety state   Current Medications:  Current Facility-Administered Medications  Medication Dose Route Frequency Provider Last Rate Last Admin  . acetaminophen (TYLENOL) tablet 650 mg  650 mg Oral Q6H PRN Ethelene Hal, NP   650 mg at 08/20/20 2104  . alum & mag hydroxide-simeth (MAALOX/MYLANTA) 200-200-20 MG/5ML suspension 30 mL  30 mL Oral Q4H PRN Ethelene Hal, NP      . buPROPion (WELLBUTRIN XL) 24 hr tablet 150 mg  150 mg Oral Daily [name], MD   150 mg at 08/26/20 2025  . clonazePAM (KLONOPIN) disintegrating tablet 0.25 mg  0.25 mg Oral Q12H PRN [name], MD   0.25 mg at 08/24/20 1410  . feeding supplement (ENSURE ENLIVE / ENSURE PLUS) liquid 237 mL  237 mL Oral BID BM [name], [name], MD   237 mL at 08/26/20 0813  . magnesium hydroxide (MILK OF MAGNESIA) suspension 30 mL  30 mL Oral Daily PRN Ethelene Hal, NP      . QUEtiapine (SEROQUEL) tablet 200 mg  200 mg Oral QHS [name], MD   200 mg at 08/25/20 2046  . tamsulosin (FLOMAX) capsule 0.4 mg  0.4 mg Oral QPC breakfast [name], MD   0.4 mg at 08/26/20 4270  . traZODone (DESYREL) tablet 50 mg  50 mg Oral QHS PRN [name], MD   50 mg at 08/25/20 2045   PTA Medications: Medications Prior to Admission  Medication Sig Dispense Refill Last Dose  . buPROPion (WELLBUTRIN XL) 150 MG 24 hr tablet Take 150 mg by mouth every morning.     . hydrOXYzine (ATARAX/VISTARIL) 25 MG tablet Take 1 tablet (25 mg total) by mouth every 6 (six) hours as needed for anxiety or vomiting. (Patient taking differently: Take 25 mg by mouth 3 (three) times  daily as needed for anxiety.) 30 tablet 0   . traZODone (DESYREL) 50 MG tablet Take 1 tablet (50 mg total) by mouth at bedtime and may repeat dose one time if needed. 15 tablet 0     Patient Stressors: Other: mental illness  Patient Strengths: Average or above average intelligence Capable of independent living Communication skills Physical Health Supportive family/friends Work skills  Treatment Modalities: Medication Management, Group therapy, Case management,  1 to 1 session with clinician, Psychoeducation, Recreational therapy.   Physician Treatment Plan for Primary Diagnosis: MDD (major depressive disorder), recurrent, severe, with psychosis ([name]) Long Term Goal(s): Improvement in symptoms so as ready for discharge Improvement in symptoms so as ready for discharge   Short Term Goals: Ability to identify changes in lifestyle to reduce recurrence of condition will improve Ability to verbalize feelings will improve Ability to disclose and discuss suicidal ideas Ability to identify and develop effective coping behaviors will improve Compliance with prescribed medications will improve Ability to identify triggers associated with substance abuse/mental health issues will improve Ability to identify changes in lifestyle to reduce recurrence of condition will improve Ability to verbalize feelings will improve Ability to disclose and discuss suicidal ideas Ability to identify and develop effective coping behaviors will improve Compliance with prescribed medications will improve Ability to identify triggers associated with substance abuse/mental health issues will improve  Medication Management: Evaluate patient's response, side effects, and tolerance of medication regimen.  Therapeutic Interventions: 1 to 1 sessions, Unit Group sessions and Medication administration.  Evaluation of Outcomes: Progressing  Physician Treatment Plan for Secondary Diagnosis: Principal Problem:   MDD  (major depressive disorder), recurrent, severe, with psychosis ([name]) Active Problems:   Anxiety state  Long Term Goal(s): Improvement in symptoms so as ready for discharge Improvement in symptoms so as ready for discharge   Short Term Goals: Ability to identify changes in lifestyle to reduce recurrence of condition will improve Ability to verbalize feelings will improve Ability to disclose and discuss suicidal ideas Ability to identify and develop effective coping behaviors will improve Compliance with prescribed medications will improve Ability to identify triggers associated with substance abuse/mental health issues will improve Ability to identify changes in lifestyle to reduce recurrence of condition will improve Ability to verbalize feelings will improve Ability to disclose and discuss suicidal ideas Ability to identify and develop effective coping behaviors will improve Compliance with prescribed medications will improve Ability to identify triggers associated with substance abuse/mental health issues will improve     Medication Management: Evaluate patient's response, side effects, and tolerance of medication regimen.  Therapeutic Interventions: 1 to 1 sessions, Unit Group sessions and Medication administration.  Evaluation of Outcomes: Progressing   RN Treatment Plan for Primary Diagnosis: MDD (major depressive disorder), recurrent, severe, with psychosis ([name]) Long Term Goal(s): Knowledge of disease and therapeutic regimen to maintain health will improve  Short Term Goals: Ability to remain free from injury will improve, Ability to verbalize frustration and anger appropriately will improve, Ability to identify and develop effective coping behaviors will improve and Compliance with prescribed medications will improve  Medication Management: RN will administer medications as ordered by provider, will assess and evaluate patient's response and provide education to patient for  prescribed medication. RN will report any adverse and/or side effects to prescribing provider.  Therapeutic Interventions: 1 on 1 counseling sessions, Psychoeducation, Medication administration, Evaluate responses to treatment, Monitor vital signs and CBGs as ordered, Perform/monitor CIWA, COWS, AIMS and Fall Risk screenings as ordered, Perform wound care treatments as ordered.  Evaluation of Outcomes: Progressing   LCSW Treatment Plan for Primary Diagnosis: MDD (major depressive disorder), recurrent, severe, with psychosis ([place]) Long Term Goal(s): Safe transition to appropriate next level of care at discharge, Engage patient in therapeutic group addressing interpersonal concerns.  Short Term Goals: Engage patient in aftercare planning with referrals and resources, Increase social support, Increase ability to appropriately verbalize feelings, Increase emotional regulation, Identify triggers associated with mental health/substance abuse issues and Increase skills for wellness and recovery  Therapeutic Interventions: Assess for all discharge needs, 1 to 1 time with Social worker, Explore available resources and support systems, Assess for adequacy in community support network, Educate family and significant other(s) on suicide prevention, Complete Psychosocial Assessment, Interpersonal group therapy.  Evaluation of Outcomes: Progressing   Progress in Treatment: Attending groups: Yes. Participating in groups: Yes. Taking medication as prescribed: Yes. Toleration medication: Yes. Family/Significant other contact made: Yes, individual(s) contacted:  wife Patient understands diagnosis: No. Discussing patient identified problems/goals with staff: Yes. Medical problems stabilized or resolved: Yes. Denies suicidal/homicidal ideation: Yes. Issues/concerns per patient self-inventory: No.   New problem(s) identified: No, Describe:  none  New Short Term/Long Term Goal(s): medication stabilization,  elimination of SI thoughts, development of comprehensive mental wellness plan.    Patient Goals:  Did not attend  Discharge Plan or Barriers: Is scheduled to receive medication  management through [facility] and has an established therapist he will return to home at discharge.  Reason for Continuation of Hospitalization: Medication stabilization  Estimated Length of Stay: 1-3 days  Attendees: Patient: Did not attend 08/26/2020   Physician:  08/26/2020   Nursing:  08/26/2020   RN Care Manager: 08/26/2020   Social Worker: Darletta Moll, LCSW 08/26/2020   Recreational Therapist:  08/26/2020   Other:  08/26/2020   Other:  08/26/2020  Other: 08/26/2020        Scribe for Treatment Team: Vassie Moselle, LCSW 08/26/2020 10:54 AM

## 2020-08-26 NOTE — Progress Notes (Addendum)
   08/26/20 1100  Psych Admission Type (Psych Patients Only)  Admission Status Voluntary  Psychosocial Assessment  Patient Complaints Depression  Eye Contact Fair  Facial Expression Anxious;Worried  Affect Appropriate to circumstance  Speech Logical/coherent;Language other than Museum/gallery conservator  Appearance/Hygiene Unremarkable  Behavior Characteristics Cooperative;Calm  Mood Pleasant  Aggressive Behavior  Effect No apparent injury  Thought Pension scheme manager thinking  Content Preoccupation;Delusions  Delusions Somatic  Perception Hallucinations  Hallucination Auditory;Visual  Judgment Limited  Confusion None  Danger to Self  Current suicidal ideation? Denies  Danger to Others  Danger to Others None reported or observed   D. Pt presents as anxious, but it appears that his mood is improving. Pt has been visible on the unit interacting appropriately with peers, and observed with Spanish interpreter attending group this am. Per pt's self inventory, pt rated his depression, hopelessness and anxiety a 5/5/4, respectively. Pt wrote that his goal is to think positive, and to be with his family soon" Pt currently denies suicidal ideation, and does not appear to be responding to internal stimuli.  A. Labs and vitals monitored. Pt compliant with medications. Pt supported emotionally and encouraged to express concerns and ask questions.   R. Pt remains safe with 15 minute checks. Will continue POC.

## 2020-08-26 NOTE — Progress Notes (Signed)
[facility] MD Progress Note  08/26/2020 4:28 PM Trapper Meech  MRN:  [id number]   Reason for admission:Casanova Ortizis a 55 year old Spanish-speaking male with a history of anxiety and depressionadmittedfor worsening symptoms of depression, increased anxiety, paranoia, hallucinations, responding to internal stimuli, not sleeping, not eating, appearing out of touch with reality and suicidal ideation with a plan to jump [place] a lake.   Objective:Medical record reviewed. Patient's case discussed in detail with members of the treatment team.  I met with and evaluated the patient on the unit for follow-up today with the assistance of an in person Spanish speaking interpreter.  The patient appears improved from last week.  He states his belief that he is doing better and is less depressed, less anxious and sleeping better.  Patient reports decreased concern about needing to urinate at night and reports improved continuity of sleep.  He denies SI, AI, HI, PI, AH or VH.  He states that his appetite is good.  He denies orthostasis or other medication side effects.  He has spoken with his family on the phone and the conversations have gone well.  The patient is noticeably more alert than he was on exam last week.  Eye contact is improved.  Speech is spontaneous and of normal rate.  Affect is significantly brighter.  Thought processes are coherent and goal-directed.  There is decreased somatic preoccupation.  There is no delusional or paranoid content elicited.  There is no evidence of formal thought disorder.  He denies perceptual abnormalities and does not appear to attend to or respond to internal stimuli.  We discussed the plan to increase his Seroquel tonight with the hope of improving sleep even more and helping him with his anxiety.  He is receptive to this plan.  The patient slept 6 hours last night.  There are no new labs today.  Vital signs this morning include BP of 133/85 sitting and 120/89 standing, pulse of 85  sitting and 91 standing, respirations of 16, O2 sat of 99% and temperature of 97.8.  Principal Problem: MDD (major depressive disorder), recurrent, severe, with psychosis ([place]) Diagnosis: Principal Problem:   MDD (major depressive disorder), recurrent, severe, with psychosis ([place]) Active Problems:   Anxiety state  Total Time spent with patient: 20 minutes  Past Psychiatric History: See admission H&P  Past Medical History:  Past Medical History:  Diagnosis Date  . Depression    previously treated with zoloft (not helpful) and prozac (made him anxious). Was under care of male therapist, about 7 yrs ago.   History reviewed. No pertinent surgical history. Family History:  Family History  Problem Relation Age of Onset  . Arthritis Mother   . Depression Mother   . Alcohol abuse Father   . Diabetes Father   . Kidney disease Brother   . Depression Maternal Grandmother    Family Psychiatric  History: See admission H&P Social History:  Social History   Substance and Sexual Activity  Alcohol Use No     Social History   Substance and Sexual Activity  Drug Use No    Social History   Socioeconomic History  . Marital status: Married    Spouse name: Not on file  . Number of children: Not on file  . Years of education: Not on file  . Highest education level: Not on file  Occupational History  . Not on file  Tobacco Use  . Smoking status: Never Smoker  . Smokeless tobacco: Never Used  Substance and Sexual Activity  .  Alcohol use: No  . Drug use: No  . Sexual activity: Not on file  Other Topics Concern  . Not on file  Social History Narrative  . Not on file   Social Determinants of Health   Financial Resource Strain: Not on file  Food Insecurity: Not on file  Transportation Needs: Not on file  Physical Activity: Not on file  Stress: Not on file  Social Connections: Not on file   Additional Social History:    Pain Medications: See MAR Prescriptions: See  MAR Over the Counter: See MAR History of alcohol / drug use?: No history of alcohol / drug abuse (Pt denies.)                    Sleep: Good  Appetite:  Good  Current Medications: Current Facility-Administered Medications  Medication Dose Route Frequency Provider Last Rate Last Admin  . acetaminophen (TYLENOL) tablet 650 mg  650 mg Oral Q6H PRN Ethelene Hal, NP   650 mg at 08/20/20 2104  . alum & mag hydroxide-simeth (MAALOX/MYLANTA) 200-200-20 MG/5ML suspension 30 mL  30 mL Oral Q4H PRN Ethelene Hal, NP      . buPROPion (WELLBUTRIN XL) 24 hr tablet 150 mg  150 mg Oral Daily [name], MD   150 mg at 08/26/20 5638  . clonazePAM (KLONOPIN) disintegrating tablet 0.25 mg  0.25 mg Oral Q12H PRN [name], MD   0.25 mg at 08/24/20 1410  . feeding supplement (ENSURE ENLIVE / ENSURE PLUS) liquid 237 mL  237 mL Oral BID BM [name], [name], MD   237 mL at 08/26/20 1441  . magnesium hydroxide (MILK OF MAGNESIA) suspension 30 mL  30 mL Oral Daily PRN Ethelene Hal, NP      . QUEtiapine (SEROQUEL) tablet 300 mg  300 mg Oral QHS [name], MD      . tamsulosin [facility]) capsule 0.4 mg  0.4 mg Oral QPC breakfast [name], MD   0.4 mg at 08/26/20 7564  . traZODone (DESYREL) tablet 50 mg  50 mg Oral QHS PRN [name], MD        Lab Results: No results found for this or any previous visit (from the past 48 hour(s)).  Blood Alcohol level:  Lab Results  Component Value Date   ETH <10 33/29/5188    Metabolic Disorder Labs: Lab Results  Component Value Date   HGBA1C 6.2 (H) 08/08/2020   MPG 131.24 08/08/2020   Lab Results  Component Value Date   PROLACTIN 6.5 08/08/2020   Lab Results  Component Value Date   CHOL 219 (H) 08/12/2020   TRIG 133 08/12/2020   HDL 72 08/12/2020   CHOLHDL 3.0 08/12/2020   VLDL 27 08/12/2020   LDLCALC 120 (H) 08/12/2020   LDLCALC 129 (H) 08/08/2020    Physical Findings: AIMS: Facial and Oral  Movements Muscles of Facial Expression: None, normal Lips and Perioral Area: None, normal Jaw: None, normal Tongue: None, normal,Extremity Movements Upper (arms, wrists, hands, fingers): None, normal Lower (legs, knees, ankles, toes): None, normal, Trunk Movements Neck, shoulders, hips: None, normal, Overall Severity Severity of abnormal movements (highest score from questions above): None, normal Incapacitation due to abnormal movements: None, normal Patient's awareness of abnormal movements (rate only patient's report): No Awareness, Dental Status Current problems with teeth and/or dentures?: No Does patient usually wear dentures?: No  CIWA:    COWS:     Musculoskeletal: Strength & Muscle  Tone: within normal limits Gait & Station: normal Patient leans: N/A  Psychiatric Specialty Exam:  Presentation  General Appearance: Appropriate for Environment; Casual  Eye Contact:Good  Speech:Clear and Coherent; Normal Rate  Speech Volume:Normal  Handedness:Right   Mood and Affect  Mood:Anxious  Affect:Full Range; Other (comment) (Noticeably brighter)   Thought Process  Thought Processes:Coherent; Goal Directed; Linear  Descriptions of Associations:Intact  Orientation:Full (Time, Place and Person)  Thought Content:Logical  History of Schizophrenia/Schizoaffective disorder:No  Duration of Psychotic Symptoms:Less than six months  Hallucinations:Hallucinations: None  Ideas of Reference:None  Suicidal Thoughts:Suicidal Thoughts: No  Homicidal Thoughts:Homicidal Thoughts: No   Sensorium  Memory:Immediate Good; Recent Good; Remote Good  Judgment:Good  Insight:Fair   Executive Functions  Concentration:Good  Attention Span:Good  [place]  Language:Good   Psychomotor Activity  Psychomotor Activity:No data recorded  Assets  Assets:Communication Skills; Desire for Improvement; Housing; Physical Health; Resilience; Social  Support   Sleep  Sleep:Sleep: Good Number of Hours of Sleep: 6    Physical Exam: Physical Exam Vitals and nursing note reviewed.  HENT:     Head: Normocephalic and atraumatic.  Pulmonary:     Effort: Pulmonary effort is normal.  Neurological:     General: No focal deficit present.     Mental Status: He is alert and oriented to person, place, and time.    Review of Systems  Respiratory: Negative.   Cardiovascular: Negative.   Gastrointestinal: Negative.   Musculoskeletal: Negative.   Neurological: Negative.   Psychiatric/Behavioral: Negative for hallucinations and suicidal ideas. The patient does not have insomnia.    Blood pressure 120/89, pulse 91, temperature 97.8 F (36.6 C), temperature source Oral, resp. rate 16, height 5' 5"  (1.651 m), weight 60.3 kg, SpO2 99 %. Body mass index is 22.13 kg/m.   Treatment Plan Summary: Daily contact with patient to assess and evaluate symptoms and progress in treatment and Medication management   Depression/anxiety -Continue Wellbutrin XL 150 mg daily -Increase Seroquel to 300 mg at bedtime for mood, anxiety, sleep  -Continue clonazepam 0.25 mg every 12 hours PRN anxiety  Insomnia -Continue trazodone50 mg at bedtimePRNinsomnia  Urinary symptoms  ("trouble urinating", no dysuria, urgency, frequency today)  -Likely secondary to anxiety; much improved -UA normal -Is/Os normal today -continue tamsulosin 0.4 mg daily after breakfast for enlarged prostate  Disposition planning in progress.  Anticipate discharge this week.    [name], MD 08/26/2020, 4:28 PM

## 2020-08-26 NOTE — Progress Notes (Signed)
   08/25/20 2204  Psych Admission Type (Psych Patients Only)  Admission Status Voluntary  Psychosocial Assessment  Patient Complaints None  Eye Contact Fair  Facial Expression Anxious;Worried  Affect Anxious;Preoccupied  Speech Logical/coherent;Language other than Physiological scientist Activity Restless;Fidgety  Appearance/Hygiene Unremarkable  Behavior Characteristics Cooperative;Calm  Mood Pleasant  Thought Process  Coherency Concrete thinking  Content Preoccupation;Delusions  Delusions Somatic  Perception Hallucinations  Hallucination Auditory;Visual  Judgment Limited  Confusion None  Danger to Self  Current suicidal ideation? Denies  Danger to Others  Danger to Others None reported or observed

## 2020-08-26 NOTE — Progress Notes (Signed)
[facility] Group Notes:  (Nursing/MHT/Case Management/Adjunct)  Date:  08/26/2020  Time:  8:56 PM  Type of Therapy:  Group Therapy  Participation Level:  Active  Participation Quality:  Appropriate  Affect:  Appropriate  Cognitive:  Alert  Insight:  Appropriate  Engagement in Group:  Developing/Improving  Modes of Intervention:  Discussion  Summary of Progress/Problems:  Maxine Glenn 08/26/2020, 8:56 PM

## 2020-08-26 NOTE — Progress Notes (Signed)
Recreation Therapy Notes  Date:  5.30.22 Time: 0930 Location: [address] Dayroom  Group Topic: Stress Management  Goal Area(s) Addresses:  Patient will identify positive stress management techniques. Patient will identify benefits of using stress management post d/c.  Intervention: Stress Management  Activity :  Meditation.  LRT played a meditation that focused on being persistent in not only meditation but in life as well.  Patients were to listen and follow along as meditation played to fully engage in activity.  Education:  Stress Management, Discharge Planning.   Education Outcome: Acknowledges Education  Clinical Observations/Feedback: Pt did not attend group session.    Victorino Sparrow, LRT/CTRS         Victorino Sparrow A 08/26/2020 12:02 PM

## 2020-08-27 NOTE — Progress Notes (Signed)
   08/26/20 2157  Psych Admission Type (Psych Patients Only)  Admission Status Voluntary  Psychosocial Assessment  Patient Complaints None  Eye Contact Fair  Facial Expression Anxious;Worried  Affect Appropriate to circumstance  Speech Logical/coherent;Language other than Physiological scientist Activity Fidgety  Appearance/Hygiene Unremarkable  Behavior Characteristics Cooperative  Mood Pleasant  Aggressive Behavior  Effect No apparent injury  Thought Pension scheme manager thinking  Content Preoccupation;Delusions  Delusions Somatic  Perception Hallucinations  Hallucination Auditory;Visual  Judgment Limited  Confusion None  Danger to Self  Current suicidal ideation? Denies  Danger to Others  Danger to Others None reported or observed

## 2020-08-27 NOTE — Progress Notes (Signed)
Pt participated in a fun activity in group.

## 2020-08-27 NOTE — Progress Notes (Signed)
Recreation Therapy Notes  Animal-Assisted Activity (AAA) Program Checklist/Progress Notes Patient Eligibility Criteria Checklist & Daily Group note for Rec Tx Intervention  Date: 5.31.22 Time: 79 Location: [place]   AAA/T Program Assumption of Risk Form signed by Teacher, music or Parent Legal Guardian YES   Patient is free of allergies or severe asthma  YES  Patient reports no fear of animals  YES  Patient reports no history of cruelty to animals YES   Patient understands his/her participation is voluntary YES  Patient washes hands before animal contact  YES   Patient washes hands after animal contact  YES  Behavioral Response: Engaged  Education: Contractor, Appropriate Animal Interaction   Education Outcome: Acknowledges understanding/In group clarification offered/Needs additional education.   Clinical Observations/Feedback:  Pt attended and participated in group activity.    Victorino Sparrow, LRT/CTRS         Victorino Sparrow A 08/27/2020 3:45 PM

## 2020-08-27 NOTE — Progress Notes (Signed)
[facility] Group Notes:  (Nursing/MHT/Case Management/Adjunct)  Date:  08/27/2020  Time:  2000 Type of Therapy:  wrap up group  Participation Level:  Minimal  Participation Quality:  Appropriate, Attentive and Supportive  Affect:  Appropriate  Cognitive:  Alert  Insight:  Appropriate  Engagement in Group:  Engaged  Modes of Intervention:  Clarification, Education and Support  Summary of Progress/Problems: Positive thinking and positive change were discussed. Pt was attentive, supportive, and  Shared using English the best he could.   Winfield Rast S 08/27/2020, 9:07 PM

## 2020-08-27 NOTE — Progress Notes (Cosign Needed)
Adult Psychoeducational Group Note  Date:  08/27/2020 Time:  10:41 AM  Group Topic/Focus:  Goals Group:   The focus of this group is to help patients establish daily goals to achieve during treatment and discuss how the patient can incorporate goal setting into their daily lives to aide in recovery.  Participation Level:  Active  Participation Quality:  Appropriate  Affect:  Appropriate  Cognitive:  Appropriate  Insight: Appropriate  Engagement in Group:  Engaged  Modes of Intervention:  Discussion  Additional Comments:  Pt attended group and participated in discussion by interpreter.  Subrena Devereux R Raissa Dam 08/27/2020, 10:41 AM

## 2020-08-27 NOTE — BHH Counselor (Addendum)
CSW spoke with Antjuan Rothe [phone] who states that her father has appointments with [place] 09/05/2020 at 2:00pm with Ralene Cork and at [facility] on 08/29/2020 with Andi Hence.  CSW spoke with Ms. Rivere about possible discharge from the hospital on 08/28/20 and Ms. Sorbo states that she and her mother are excited for him to come home and believes that he is doing much better. Ms. Dermody states that her father told her that he intends to stay on his medication this time because he does not wish to feel this way again.  CSW will follow up with the family at discharge to confirm all appointments and discharge times.   Ms. Caban also states that she and her mother would like Mr. Delila Pereyra prescriptions sent to the [facility] at [place], [place], [place] [address] prior to his discharge.

## 2020-08-27 NOTE — Progress Notes (Addendum)
[facility] MD Progress Note  08/27/2020 5:17 PM Darryl Wang  MRN:  [id number]   Reason for admission:Darryl Ortizis a 55 year old Spanish-speaking male with a history of anxiety and depressionadmittedfor worsening symptoms of depression, increased anxiety, paranoia, hallucinations, responding to internal stimuli, not sleeping, not eating, appearing out of touch with reality and suicidal ideation with a plan to jump in a lake.   Objective:Medical record reviewed. Patient's case discussed in detail with members of the treatment team.MD and medical student met with and evaluated the patient on the unit for follow-up today with the assistance of an in person Spanish speaking interpreter.  Darryl Wang appears improved from last week, very alert and engaged during the interview.  He states his belief that he is doing better and is less depressed, less anxious and sleeping better. He rates his anxiety at a 6/10 today, and nursing reports sleep as 5.5 hours last night. He denies SI, AI, HI, PI, AH or VH. He states that his appetite is good, he is still having regular bowel movements.  He denies orthostasis and fatigue. He endorses some mild headaches but is not concerned about them.  He has spoken with his family on the phone and the conversations have gone well. He denies perceptual abnormalities and does not appear to attend to or respond to internal stimuli.  Darryl Wang is looking forward to going home, is feeling positive today. He requested a note for work and inquired about timeline for returning to work.  MD answered his questions.  There are no new labs today.  Vital signs this morning include BP of 133/85 sitting and 120/89 standing, pulse of 85 sitting and 91 standing, respirations of 16, O2 sat of 99% and temperature of 97.8.   Principal Problem: MDD (major depressive disorder), recurrent, severe, with psychosis ([place]) Diagnosis: Principal Problem:   MDD (major depressive disorder), recurrent, severe, with psychosis  ([place]) Active Problems:   Anxiety state  Total Time spent with patient: 20 minutes  Past Psychiatric History: See admission H&P  Past Medical History:  Past Medical History:  Diagnosis Date  . Depression    previously treated with zoloft (not helpful) and prozac (made him anxious). Was under care of male therapist, about 7 yrs ago.   History reviewed. No pertinent surgical history. Family History:  Family History  Problem Relation Age of Onset  . Arthritis Mother   . Depression Mother   . Alcohol abuse Father   . Diabetes Father   . Kidney disease Brother   . Depression Maternal Grandmother    Family Psychiatric  History: See admission H&P Social History:  Social History   Substance and Sexual Activity  Alcohol Use No     Social History   Substance and Sexual Activity  Drug Use No    Social History   Socioeconomic History  . Marital status: Married    Spouse name: Not on file  . Number of children: Not on file  . Years of education: Not on file  . Highest education level: Not on file  Occupational History  . Not on file  Tobacco Use  . Smoking status: Never Smoker  . Smokeless tobacco: Never Used  Substance and Sexual Activity  . Alcohol use: No  . Drug use: No  . Sexual activity: Not on file  Other Topics Concern  . Not on file  Social History Narrative  . Not on file   Social Determinants of Health   Financial Resource Strain: Not on file  Food  Insecurity: Not on file  Transportation Needs: Not on file  Physical Activity: Not on file  Stress: Not on file  Social Connections: Not on file   Additional Social History:    Pain Medications: See MAR Prescriptions: See MAR Over the Counter: See MAR History of alcohol / drug use?: No history of alcohol / drug abuse (Pt denies.)                    Sleep: Good  Appetite:  Good  Current Medications: Current Facility-Administered Medications  Medication Dose Route Frequency Provider Last  Rate Last Admin  . acetaminophen (TYLENOL) tablet 650 mg  650 mg Oral Q6H PRN Ethelene Hal, NP   650 mg at 08/20/20 2104  . alum & mag hydroxide-simeth (MAALOX/MYLANTA) 200-200-20 MG/5ML suspension 30 mL  30 mL Oral Q4H PRN Ethelene Hal, NP      . buPROPion (WELLBUTRIN XL) 24 hr tablet 150 mg  150 mg Oral Daily [name], MD   150 mg at 08/27/20 5462  . clonazePAM (KLONOPIN) disintegrating tablet 0.25 mg  0.25 mg Oral Q12H PRN [name], MD   0.25 mg at 08/24/20 1410  . feeding supplement (ENSURE ENLIVE / ENSURE PLUS) liquid 237 mL  237 mL Oral BID BM [name], [name], MD   237 mL at 08/27/20 1454  . magnesium hydroxide (MILK OF MAGNESIA) suspension 30 mL  30 mL Oral Daily PRN Ethelene Hal, NP      . QUEtiapine (SEROQUEL) tablet 300 mg  300 mg Oral QHS [name], MD   300 mg at 08/26/20 2042  . tamsulosin (FLOMAX) capsule 0.4 mg  0.4 mg Oral QPC breakfast [name], MD   0.4 mg at 08/27/20 7035  . traZODone (DESYREL) tablet 50 mg  50 mg Oral QHS PRN [name], MD   50 mg at 08/26/20 2043    Lab Results: No results found for this or any previous visit (from the past 82 hour(s)).  Blood Alcohol level:  Lab Results  Component Value Date   ETH <10 00/93/8182    Metabolic Disorder Labs: Lab Results  Component Value Date   HGBA1C 6.2 (H) 08/08/2020   MPG 131.24 08/08/2020   Lab Results  Component Value Date   PROLACTIN 6.5 08/08/2020   Lab Results  Component Value Date   CHOL 219 (H) 08/12/2020   TRIG 133 08/12/2020   HDL 72 08/12/2020   CHOLHDL 3.0 08/12/2020   VLDL 27 08/12/2020   LDLCALC 120 (H) 08/12/2020   LDLCALC 129 (H) 08/08/2020    Physical Findings: AIMS: Facial and Oral Movements Muscles of Facial Expression: None, normal Lips and Perioral Area: None, normal Jaw: None, normal Tongue: None, normal,Extremity Movements Upper (arms, wrists, hands, fingers): None, normal Lower (legs, knees, ankles, toes): None,  normal, Trunk Movements Neck, shoulders, hips: None, normal, Overall Severity Severity of abnormal movements (highest score from questions above): None, normal Incapacitation due to abnormal movements: None, normal Patient's awareness of abnormal movements (rate only patient's report): No Awareness, Dental Status Current problems with teeth and/or dentures?: No Does patient usually wear dentures?: No  CIWA:    COWS:     Musculoskeletal: Strength & Muscle Tone: within normal limits Gait & Station: normal Patient leans: N/A  Psychiatric Specialty Exam:  Presentation  General Appearance: Appropriate for Environment; Casual  Eye Contact:Good  Speech:Clear and Coherent; Normal Rate  Speech Volume:Normal  Handedness:Right   Mood and Affect  Mood:Euthymic  Affect:Full Range   Thought Process  Thought Processes:Coherent; Goal Directed; Linear  Descriptions of Associations:Intact  Orientation:Full (Time, Place and Person)  Thought Content:Logical  History of Schizophrenia/Schizoaffective disorder:No  Duration of Psychotic Symptoms:Less than six months  Hallucinations:Hallucinations: None  Ideas of Reference:None  Suicidal Thoughts:Suicidal Thoughts: No  Homicidal Thoughts:Homicidal Thoughts: No   Sensorium  Memory:Immediate Good; Recent Good; Remote Good  Judgment:Good  Insight:Fair   Executive Functions  Concentration:Good  Attention Span:Good  [place] of Knowledge:Good  Language:Good   Psychomotor Activity  Psychomotor Activity:Psychomotor Activity: Normal   Assets  Assets:Communication Skills; Desire for Improvement; Housing; Physical Health; Resilience; Social Support; Vocational/Educational   Sleep  Sleep:Sleep: Good Number of Hours of Sleep: 5.5    Physical Exam: Physical Exam Vitals and nursing note reviewed.  Constitutional:      General: He is not in acute distress. HENT:     Head: Normocephalic and atraumatic.   Pulmonary:     Effort: Pulmonary effort is normal.  Neurological:     General: No focal deficit present.     Mental Status: He is alert and oriented to person, place, and time.  Psychiatric:        Behavior: Behavior normal.        Thought Content: Thought content normal.        Judgment: Judgment normal.    Review of Systems  Constitutional: Negative.   HENT: Negative.   Respiratory: Negative.   Cardiovascular: Negative.   Gastrointestinal: Negative for constipation, diarrhea, nausea and vomiting.  Genitourinary: Negative.   Musculoskeletal: Negative.   Skin: Negative.   Neurological: Positive for headaches. Negative for dizziness.  Psychiatric/Behavioral: Negative for depression, hallucinations and suicidal ideas. The patient is nervous/anxious. The patient does not have insomnia.    Blood pressure 121/86, pulse 92, temperature 97.8 F (36.6 C), temperature source Oral, resp. rate 16, height 5' 5"  (1.651 m), weight 60.3 kg, SpO2 96 %. Body mass index is 22.13 kg/m.   Treatment Plan Summary: Daily contact with patient to assess and evaluate symptoms and progress in treatment and Medication management   Depression/anxiety -Continue Wellbutrin XL 150 mg daily -Continue Seroquel 300 mg at bedtime for mood, anxiety, sleep  -Continue clonazepam 0.25 mg every 12 hours PRN anxiety  Insomnia -Continue trazodone50 mg at bedtimePRNinsomnia  Urinary symptoms -Likely secondary to anxiety; much improved -UA normal 5/18 -Is/Os normal through 5/29, discontinue tracking -continue tamsulosin 0.4 mg daily after breakfast for enlarged prostate  Disposition planning in progress.  Anticipate discharge tomorrow.   Maxwell Marion, Medical Student Ethelene Browns, MD  I attest that I saw and interviewed the patient and that I performed or reperformed the mental status examination of the patient. I reviewed the medical record. I have formulated the plan of treatment. I am in  agreement with the exam, assessment and plan as documented above.   [name], MD 08/27/2020, 5:17 PM   Addendum: I attempted to make direct phone contact with patient's daughter Darryl Wang today with patient's permission [phone]).  Left voicemail requesting return call.

## 2020-08-28 MED ORDER — TAMSULOSIN HCL 0.4 MG PO CAPS: 0.4000 mg | ORAL_CAPSULE | Freq: Every day | ORAL | 0 refills | Status: DC

## 2020-08-28 MED ORDER — BUPROPION HCL ER (XL) 150 MG PO TB24: 150.0000 mg | ORAL_TABLET | Freq: Every day | ORAL | 0 refills | Status: DC

## 2020-08-28 MED ORDER — QUETIAPINE FUMARATE 300 MG PO TABS: 300.0000 mg | ORAL_TABLET | Freq: Every day | ORAL | 0 refills | Status: DC

## 2020-08-28 MED ORDER — TRAZODONE HCL 50 MG PO TABS: 50.0000 mg | ORAL_TABLET | Freq: Every evening | ORAL | 0 refills | Status: DC | PRN

## 2020-08-28 NOTE — Progress Notes (Signed)
Recreation Therapy Notes  Date: 6.1.22 Time: 0930 Location: 300 Hall Dayroom  Group Topic: Stress Management   Goal Area(s) Addresses:  Patient will actively participate in stress management techniques presented during session.  Patient will successfully identify benefit of practicing stress management post d/c.   Intervention: Guided exercise with ambient sound and script  Activity :Guided Imagery  LRT read a beach visualization script to patients.  Patients were asked to participate in the technique introduced during introduction. Patients were given suggestions of ways to access scripts post d/c and encouraged to explore Youtube and other apps available on smartphones, tablets, and computers.   Education:  Stress Management, Discharge Planning.   Education Outcome: Acknowledges education  Clinical Observations/Feedback: Patient did not attend group session.   Victorino Sparrow, LRT/CTRS         Victorino Sparrow A 08/28/2020 12:36 PM

## 2020-08-28 NOTE — Progress Notes (Signed)
   D:  Patient assessment completed with assistance of Spanish Interpreter # [id number], Darryl Wang.  Patient states his day was "good, leaving tomorrow."  Patient observed interacting with peers in dayroom.  Patient reports anxiety and depression 8/10 and a mild headache.  Patient refuses medication for headache pain.  Patient denies SI/HI, but verbally contracts to see staff if the thoughts arise.  Patient denies AVH, stating "no, not now, sometimes see people."  I asked the name of the people.  Patient states, "People here he says hi to, they stay in his head."  I asked if they are good people?  Patient states, "Yes, people all good."  Patient was reminded of medication administered (See MAR) and advised that he see me if he has trouble sleeping.  A:  Labs/Vitals monitored; Medication education provided; Patient supported emotionally; Patient asked to communicate her needs, concerns, and questions.  R:  Patient remains safe with 15 minute checks; Will continue POC.       08/27/20 2300  Psych Admission Type (Psych Patients Only)  Admission Status Voluntary  Psychosocial Assessment  Patient Complaints Other (Comment) (Pt states he's good; leaving tomorrow)  Eye Contact Fair  Facial Expression Anxious  Affect Appropriate to circumstance  Speech Logical/coherent;Language other than Physiological scientist Activity Fidgety  Appearance/Hygiene Unremarkable  Behavior Characteristics Cooperative  Mood Pleasant  Thought Process  Coherency Concrete thinking  Content Preoccupation;Delusions  Delusions Somatic  Perception Hallucinations  Hallucination Auditory;Visual  Judgment Limited  Confusion None  Danger to Self  Current suicidal ideation? Denies  Danger to Others  Danger to Others None reported or observed

## 2020-08-28 NOTE — Progress Notes (Signed)
  [facility] Adult Case Management Discharge Plan :  Will you be returning to the same living situation after discharge:  Yes,  Home  At discharge, do you have transportation home?: Yes,  Wife Do you have the ability to pay for your medications: Yes,  Insurance   Release of information consent forms completed and in the chart;  Patient's signature needed at discharge.  Patient to Follow up at:  Follow-up Information    [facility] Follow up on 09/05/2020.   Why: You have an appointment for medication management on 09/05/2020 at 2:00pm with Ralene Cork and these appointments will be Virtual with a Spanish interpreter.  Contact information: [address]  [address] [place] [address] [place]. Follow up on 08/29/2020.   Why: Please call this agency on 08/28/2020 to schedule an appointment for 08/29/2020 with Andi Hence. Contact information: Address: [address] [place], [place] [address]  Phone: [phone]              Next level of care provider has access to [place] and Suicide Prevention discussed: Yes,  with patient, wife, and daughter     Has patient been referred to the Quitline?: N/A patient is not a smoker  Patient has been referred for addiction treatment: [place], [place] 08/28/2020, 10:32 AM

## 2020-08-28 NOTE — Discharge Summary (Signed)
Physician Discharge Summary Note  Patient:  Darryl Wang is an 55 y.o., male MRN:  [id number] DOB:  01-Sep-1965 Patient phone:  [phone] (home)  Patient address:   [address] [place] [address],   Total Time spent with patient: Greater than 30 minutes  Date of Admission:  08/10/2020  Date of Discharge: 08-28-20  Reason for Admission: Worsening symptoms of depression triggered by threats of marital separation.  Principal Problem: MDD (major depressive disorder), recurrent, severe, with psychosis ([place])  Discharge Diagnoses: Principal Problem:   MDD (major depressive disorder), recurrent, severe, with psychosis ([place]) Active Problems:   Anxiety state  Past Psychiatric History: Major depressive disorder  Past Medical History:  Past Medical History:  Diagnosis Date  . Depression    previously treated with zoloft (not helpful) and prozac (made him anxious). Was under care of male therapist, about 7 yrs ago.   History reviewed. No pertinent surgical history.  Family History:  Family History  Problem Relation Age of Onset  . Arthritis Mother   . Depression Mother   . Alcohol abuse Father   . Diabetes Father   . Kidney disease Brother   . Depression Maternal Grandmother    Family Psychiatric  History: See H&P  Social History:  Social History   Substance and Sexual Activity  Alcohol Use No     Social History   Substance and Sexual Activity  Drug Use No    Social History   Socioeconomic History  . Marital status: Married    Spouse name: Not on file  . Number of children: Not on file  . Years of education: Not on file  . Highest education level: Not on file  Occupational History  . Not on file  Tobacco Use  . Smoking status: Never Smoker  . Smokeless tobacco: Never Used  Substance and Sexual Activity  . Alcohol use: No  . Drug use: No  . Sexual activity: Not on file  Other Topics Concern  . Not on file  Social History Narrative  .  Not on file   Social Determinants of Health   Financial Resource Strain: Not on file  Food Insecurity: Not on file  Transportation Needs: Not on file  Physical Activity: Not on file  Stress: Not on file  Social Connections: Not on file   Hospital Course: (Per Md's admission evaluation notes): Patient was interviewed with in-person interpreter in the presence of APP and social work. He reports a remote h/o depression years ago for which he was on an antidepressant that he does not recall the name of. Per his ED assessment, he was treated for depression 15 years ago at which time he was tried on Prozac but he could not tolerate the medication due to side-effects.  He states he stopped the antidepressant years ago when he was feeling better, but intermittently in the last several years, he has used homeopathic treatment for anxiety and depression including questionable valerian root and St. John's Wort. He reports that in the last few months he started having increased anxiety, ruminations, and depressed mood. He thinks that his main stressor has been relationship worries in his marriage and his wife's threats for separation. He states a primary care provider recently started him on a new antidepressant, and per EHR he has been on Wellbutrin, Vistaril, and Trazodone or the last week. He states that in the last week he has been pacing at home, is restless, cannot sleep well, and has been feeling extremely  anxious. He reports anhedonia, poor focus, and poor appetite associated with his depression and anxiety. He states that in the context of his mood issues he has taken a leave of absence from work and is now stressed due to reduced pay that is coming in while he is on medical leave. Recently he states he has had thoughts of suicide by potentially drowning [place] but has not acted on these thoughts. He denies current SI, intent or plan and denies HI. He states in the last few weeks he has belief that "music  is coming from my stomach" and that he is hearing voices. He has been ruminating with worry that he could have a terminal illness based on "what my body is telling me" and states he hears the word "cancer" inside his head. He describes feeling "out of touch with reality" and believes he is losing track of time. Per ED notes, his wife reported that he has so many fears that he does not recall if he has showered, has to be reminded to eat, and seems more forgetful and distracted. He denies ideas of reference or first rank symptoms. He denies h/o mania/hypomania and denies h/o alcohol abuse or illicit drug use. He denies h/o TBI or seizures and denies PMH. He denies past suicide attempts or previous psychiatric inpatient admissions and denies known suicides in the family. He has 2 children with a h/o depression and states his father was an alcoholic. See H&P for additional details.   This discharge assessment was conducted using a language interpreter from the language resource.  Prior to this discharge, Estuardo was seen & evaluated for mental health stability. The current laboratory findings were reviewed (stable), nurses notes & vital signs were reviewed as well. There are no current mental health or medical issues that should prevent this discharge at this time. Patient is being discharged to continue mental health care as noted below.   Although with what seem like an extensive hx of mental health issues & multiple outpatient psychiatric clinic visits & use of homeopathic treatment for anxiety and depression including questionable valerian root and St. John's Wort as per chart review, this is [name]'s first psychiatric admission/discharge summary from this [facility]. He was admitted with complaint of worsening symptoms of depression triggered by threats of marital separation. He was recommended for mood stabilization treatments by his treatment team after his admission evaluation. And with his consent he received,  stabilized & was discharged on the medications as listed below on his discharge medication lists. He was also enrolled & participated in the group counseling sessions being offered & held on this unit. He learned coping skills. He presented other significant pre-existing medical conditions that required treatment or monitoring. He was treated & discharged on medications for those health issues. He tolerated his treatment regimen without any adverse effects or reactions reported.  During the course of this hospitalization, the 15-minute checks were adequate to ensure Jeziel's safety. Patient did not display any dangerous, violent or suicidal behavior on the unit.  He interacted with staff appropriately using an interpreter from the language resource. He participated appropriately in the group sessions/therapies. His medications were addressed & adjusted to meet his needs. He was recommended for an outpatient follow-up care & medication management upon discharge to assure his continuity of care.  At the time of discharge, patient is not reporting any acute suicidal/homicidal ideations. He feels he benefited from being in the hospital. He currently denies any new issues or concerns.  Education and supportive counseling provided throughout her hospital stay & upon discharge.   Today upon his discharge evaluation with the attending psychiatrist, Masyn shares he is doing well. He denies any other specific concerns. He is sleeping well. His appetite is good. He denies other physical complaints. He denies AH/VH, delusional thoughts or paranoia. He feels that his medications have been helpful & is in agreement to continue his current treatment regimen as recommended. He was able to engage in safety planning including plan to return to [facility] or contact emergency services if he feels unable to maintain his own safety or the safety of others. Pt had no further questions, comments, or concerns. He left [facility] with all personal  belongings in no apparent distress. Transportation per his family (wife).   Physical Findings: AIMS: Facial and Oral Movements Muscles of Facial Expression: None, normal Lips and Perioral Area: None, normal Jaw: None, normal Tongue: None, normal,Extremity Movements Upper (arms, wrists, hands, fingers): None, normal Lower (legs, knees, ankles, toes): None, normal, Trunk Movements Neck, shoulders, hips: None, normal, Overall Severity Severity of abnormal movements (highest score from questions above): None, normal Incapacitation due to abnormal movements: None, normal Patient's awareness of abnormal movements (rate only patient's report): No Awareness, Dental Status Current problems with teeth and/or dentures?: No Does patient usually wear dentures?: No  CIWA:    COWS:     Musculoskeletal: Strength & Muscle Tone: within normal limits Gait & Station: normal Patient leans: N/A  Psychiatric Specialty Exam:  Presentation  General Appearance: Appropriate for Environment; Casual  Eye Contact:Good  Speech:Clear and Coherent; Normal Rate  Speech Volume:Normal  Handedness:Right  Mood and Affect  Mood:Euthymic  Affect:Full Range  Thought Process  Thought Processes:Coherent; Goal Directed; Linear  Descriptions of Associations:Intact  Orientation:Full (Time, Place and Person)  Thought Content:Logical  History of Schizophrenia/Schizoaffective disorder:No  Duration of Psychotic Symptoms:Less than six months  Hallucinations:Hallucinations: None  Ideas of Reference:None  Suicidal Thoughts:Suicidal Thoughts: No  Homicidal Thoughts:Homicidal Thoughts: No  Sensorium  Memory:Immediate Good; Recent Good; Remote Good  Judgment:Good  Insight:Fair  Executive Functions  Concentration:Good  Attention Span:Good  [place] of Knowledge:Good  Language:Good  Psychomotor Activity  Psychomotor Activity:Psychomotor Activity: Normal  Assets   Assets:Communication Skills; Desire for Improvement; Housing; Physical Health; Resilience; Social Support; Vocational/Educational  Sleep  Sleep:Sleep: Good Number of Hours of Sleep: 5.5  Physical Exam: Physical Exam Vitals and nursing note reviewed.  HENT:     Head: Normocephalic.     Nose: Nose normal.     Mouth/Throat:     Pharynx: Oropharynx is clear.  Eyes:     Pupils: Pupils are equal, round, and reactive to light.  Cardiovascular:     Rate and Rhythm: Normal rate.     Pulses: Normal pulses.  Pulmonary:     Effort: Pulmonary effort is normal.  Genitourinary:    Comments: Deferred Musculoskeletal:        General: Normal range of motion.     Cervical back: Normal range of motion.  Skin:    General: Skin is warm and dry.  Neurological:     General: No focal deficit present.     Mental Status: He is alert and oriented to person, place, and time. Mental status is at baseline.    Review of Systems  Constitutional: Negative.   HENT: Negative.   Eyes: Negative.   Respiratory: Negative.   Cardiovascular: Negative.   Gastrointestinal: Negative.   Genitourinary: Negative.   Musculoskeletal: Negative.   Skin: Negative.  Neurological: Negative for dizziness, tingling, tremors, sensory change, speech change, focal weakness, seizures, loss of consciousness, weakness and headaches.  Endo/Heme/Allergies:       Allergies: NKDA  Psychiatric/Behavioral: Positive for depression (Stable on medication). Negative for hallucinations, memory loss, substance abuse and suicidal ideas. The patient has insomnia (Stable on medication). The patient is not nervous/anxious (Stable upon discharge).    Blood pressure 128/83, pulse 93, temperature (!) 97.4 F (36.3 C), temperature source Oral, resp. rate 18, height 5\' 5"  (1.651 m), weight 60.3 kg, SpO2 99 %. Body mass index is 22.13 kg/m.  Has this patient used any form of tobacco in the last 30 days? (Cigarettes, Smokeless Tobacco, Cigars,  and/or Pipes): N/A  Blood Alcohol level:  Lab Results  Component Value Date   ETH <10 09/81/1914   Metabolic Disorder Labs:  Lab Results  Component Value Date   HGBA1C 6.2 (H) 08/08/2020   MPG 131.24 08/08/2020   Lab Results  Component Value Date   PROLACTIN 6.5 08/08/2020   Lab Results  Component Value Date   CHOL 219 (H) 08/12/2020   TRIG 133 08/12/2020   HDL 72 08/12/2020   CHOLHDL 3.0 08/12/2020   VLDL 27 08/12/2020   LDLCALC 120 (H) 08/12/2020   LDLCALC 129 (H) 08/08/2020   See Psychiatric Specialty Exam and Suicide Risk Assessment completed by Attending Physician prior to discharge.  Discharge destination:  Home  Is patient on multiple antipsychotic therapies at discharge:  No   Has Patient had three or more failed trials of antipsychotic monotherapy by history:  No  Recommended Plan for Multiple Antipsychotic Therapies: NA  Allergies as of 08/28/2020      Reactions   Penicillins Hives      Medication List    STOP taking these medications   hydrOXYzine 25 MG tablet Commonly known as: ATARAX/VISTARIL     TAKE these medications     Indication  buPROPion 150 MG 24 hr tablet Commonly known as: WELLBUTRIN XL Take 1 tablet (150 mg total) by mouth daily. For depression Start taking on: August 29, 2020 What changed:   when to take this  additional instructions  Indication: Major Depressive Disorder   QUEtiapine 300 MG tablet Commonly known as: SEROQUEL Take 1 tablet (300 mg total) by mouth at bedtime. For mood control  Indication: Mood control   tamsulosin 0.4 MG Caps capsule Commonly known as: FLOMAX Take 1 capsule (0.4 mg total) by mouth daily after breakfast. For enlarged prostate Start taking on: August 29, 2020  Indication: Benign Enlargement of Prostate   traZODone 50 MG tablet Commonly known as: DESYREL Take 1 tablet (50 mg total) by mouth at bedtime as needed. For sleep What changed:   when to take this  reasons to take this  additional  instructions  Indication: [place] Follow up on 09/05/2020.   Why: You have an appointment for medication management on 09/05/2020 at 2:00pm with Ralene Cork and these appointments will be Virtual with a Spanish interpreter.  Contact information: [address]  [address] [place] [address] [place]. Follow up on 08/29/2020.   Why: Please call this agency on 08/28/2020 to schedule an appointment for 08/29/2020 with Andi Hence. Contact information: Address: [address] [place], [place] [address]  Phone: [phone]  Follow-up recommendations: Activity:  As tolerated Diet: As recommended by your primary care doctor. Keep all scheduled follow-up appointments as recommended.    Comments: Prescriptions given at discharge.  Patient agreeable to plan.  Given opportunity to ask questions.  Appears to feel comfortable with discharge denies any current suicidal or homicidal thought. Patient is also instructed prior to discharge to: Take all medications as prescribed by his/her mental healthcare provider. Report any adverse effects and or reactions from the medicines to his/her outpatient provider promptly. Patient has been instructed & cautioned: To not engage in alcohol and or illegal drug use while on prescription medicines. In the event of worsening symptoms, patient is instructed to call the crisis hotline, 911 and or go to the nearest ED for appropriate evaluation and treatment of symptoms. To follow-up with his/her primary care provider for your other medical issues, concerns and or health care needs.  Signed: Lindell Spar, NP, pmhnp, fnp-bc 08/28/2020, 10:04 AM

## 2020-08-28 NOTE — Progress Notes (Signed)
RN met with pt and reviewed pt's discharge instructions via interpreter service.  Pt verbalized understanding of discharge instructions and pt did not have any questions. RN reviewed and provided pt with a copy of SRA, AVS and Transition Record.  RN returned pt's belongings to pt.  Pt denied SI/HI/AVH and voiced no concerns.  Pt was appreciative of the care pt received at [facility].  Patient discharged to the lobby where pt's wife and daughter were waiting for pt.

## 2020-08-28 NOTE — Plan of Care (Signed)
Yesterday evening (08/27/2020) at approximately 5:37 PM, I made direct phone contact with patient's daughter Nikkolas Coomes [phone]) with patient's permission and we spoke for more than 20 minutes.  Topics discussed included but were not limited to information and education regarding the patient's diagnosis, symptoms, medications, anticipated treatment outcomes, date of anticipated discharge, aftercare plans, etc.  Ms. Crum was provided an opportunity to ask questions and verbalize concerns.  I answered her questions.  Ms. Birky verbalized understanding of information discussed and expressed appreciation for the phone call.

## 2020-08-28 NOTE — BHH Suicide Risk Assessment (Signed)
[facility] Discharge Suicide Risk Assessment   Principal Problem: MDD (major depressive disorder), recurrent, severe, with psychosis ([place]) Discharge Diagnoses: Principal Problem:   MDD (major depressive disorder), recurrent, severe, with psychosis ([place]) Active Problems:   Anxiety state   Total Time spent with patient: 20 minutes  Musculoskeletal: Strength & Muscle Tone: within normal limits Gait & Station: normal Patient leans: N/A  Psychiatric Specialty Exam  Presentation  General Appearance: Appropriate for Environment; Casual  Eye Contact:Good  Speech:Clear and Coherent; Normal Rate  Speech Volume:Normal  Handedness:Right   Mood and Affect  Mood:Euthymic  Duration of Depression Symptoms: Greater than two weeks  Affect:Full Range   Thought Process  Thought Processes:Coherent; Goal Directed; Linear  Descriptions of Associations:Intact  Orientation:Full (Time, Place and Person)  Thought Content:Logical  History of Schizophrenia/Schizoaffective disorder:No  Duration of Psychotic Symptoms:Less than six months  Hallucinations:Hallucinations: None  Ideas of Reference:None  Suicidal Thoughts:Suicidal Thoughts: No  Homicidal Thoughts:Homicidal Thoughts: No   Sensorium  Memory:Immediate Good; Recent Good; Remote Good  Judgment:Good  Insight:Fair   Executive Functions  Concentration:Good  Attention Span:Good  [place] of Knowledge:Good  Language:Good   Psychomotor Activity  Psychomotor Activity:Psychomotor Activity: Normal   Assets  Assets:Communication Skills; Desire for Improvement; Housing; Physical Health; Resilience; Social Support; Vocational/Educational   Sleep  Sleep:Sleep: Good Number of Hours of Sleep: 6   Physical Exam: Physical Exam Vitals and nursing note reviewed.  Constitutional:      General: He is not in acute distress. HENT:     Head: Normocephalic.  Pulmonary:     Effort: Pulmonary effort is normal.   Neurological:     General: No focal deficit present.     Mental Status: He is alert and oriented to person, place, and time.    Review of Systems  Constitutional: Negative.   Respiratory: Negative.   Cardiovascular: Negative.   Gastrointestinal: Negative.   Genitourinary: Negative.   Musculoskeletal: Negative.   Neurological: Negative.   Psychiatric/Behavioral: Negative for hallucinations and suicidal ideas. The patient does not have insomnia.    Blood pressure 128/83, pulse 93, temperature (!) 97.4 F (36.3 C), temperature source Oral, resp. rate 18, height 5\' 5"  (1.651 m), weight 60.3 kg, SpO2 99 %. Body mass index is 22.13 kg/m.  Mental Status Per Nursing Assessment::   On Admission:  Suicidal ideation indicated by others  Demographic Factors:  Male  Loss Factors: Decrease in vocational status  Historical Factors: Family history of mental illness or substance abuse and History of childhood trauma  Risk Reduction Factors:   Responsible for children under 27 years of age, Sense of responsibility to family, Employed, Living with another person, especially a relative and Positive social support  Continued Clinical Symptoms:  Anxiety -improved Depression -improved Previous Psychiatric Diagnoses and Treatments  Cognitive Features That Contribute To Risk:  None    Suicide Risk:  Minimal: No identifiable suicidal ideation.  Patients presenting with no risk factors but with morbid ruminations; may be classified as minimal risk based on the severity of the depressive symptoms   Follow-up Information    [place] Follow up on 09/05/2020.   Why: You have an appointment for medication management on 09/05/2020 at 2:00pm with Ralene Cork and these appointments will be Virtual with a Spanish interpreter.  Contact information: [address]  [address] [place] [address] [place]. Follow up on 08/29/2020.    Why: Please call this agency  on 08/28/2020 to schedule an appointment for 08/29/2020 with Andi Hence. Contact information: Address: [address] [place],  [address]  Phone: [phone]              Plan Of Care/Follow-up recommendations:  Activity:  As tolerated  Tests:  You will periodically need to have blood drawn for lab work to monitor cholesterol and blood sugar levels while you are taking Seroquel.  Your outpatient doctor will tell you when lab work needs to be performed.  Other:   -Take medications as prescribed.   - Do not drink alcohol.  Do not use marijuana or other drugs.   - Keep your outpatient mental health follow-up appointments with therapist and psychiatrist.   - See your primary care provider regarding treatment of any medical conditions.    [name], MD 08/28/2020, 11:15 AM

## 2020-08-28 NOTE — BHH Group Notes (Signed)
The focus of this group is to help patients establish daily goals to achieve during treatment and discuss how the patient can incorporate goal setting into their daily lives to aide in recovery.  Pt did not attend group 

## 2021-06-12 IMAGING — CT CT HEAD W/O CM
4 series · 16 of 47 positions shown, 18 images · non-contrast
Comparison: No pertinent prior exams available for comparison.

CLINICAL DATA: Psychosis; new onset psychosis.

EXAM:
CT HEAD WITHOUT CONTRAST
TECHNIQUE: Contiguous axial images were obtained from the base of the skull
through the vertex without intravenous contrast.

[Series 2: head wo · axial · 0.47mm/px · z∈[+1433,+1553]mm · 7 of 32 slices shown, 9 images]
[im 4/32  brain]
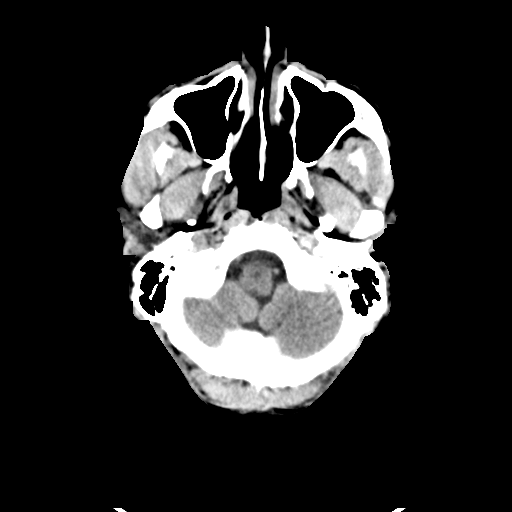
[im 4/32  bone]
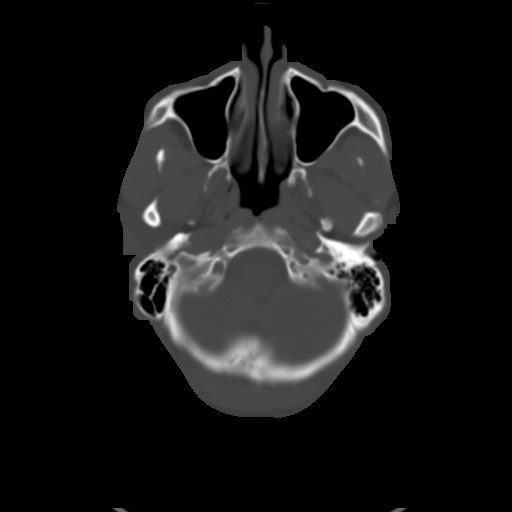
[im 8/32  brain]
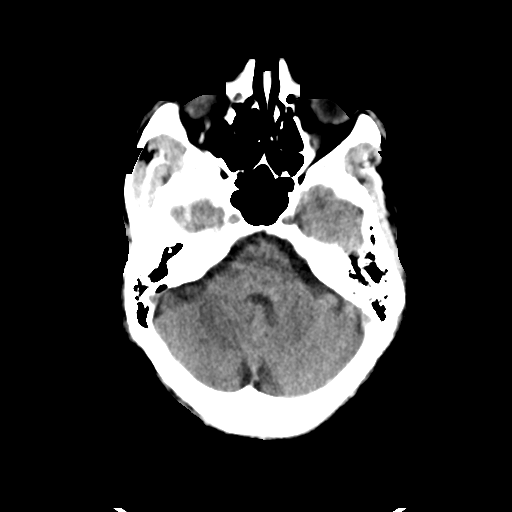
[im 12/32  brain]
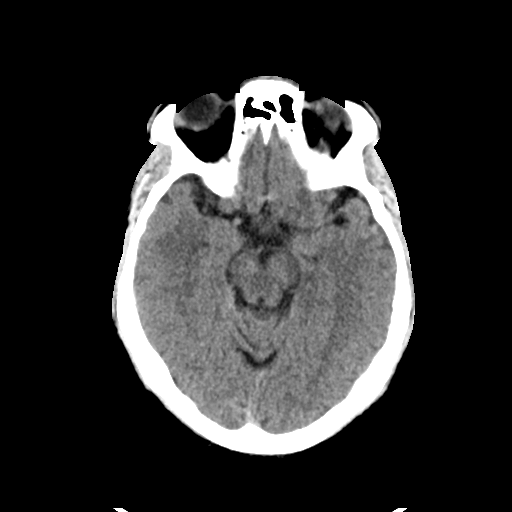
[im 16/32  brain]
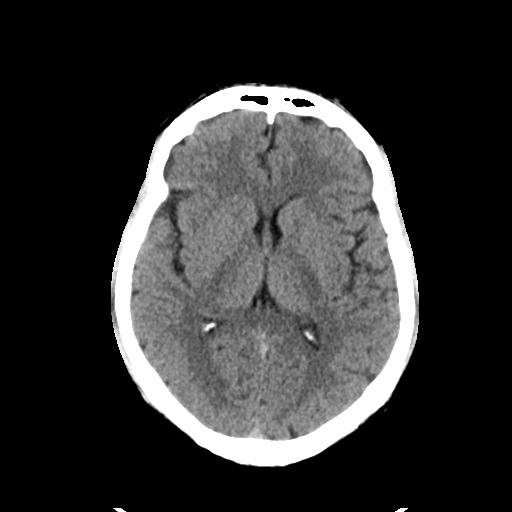
[im 20/32  brain]
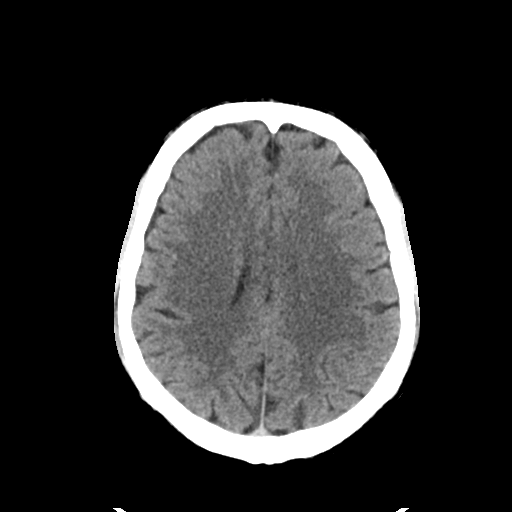
[im 20/32  bone]
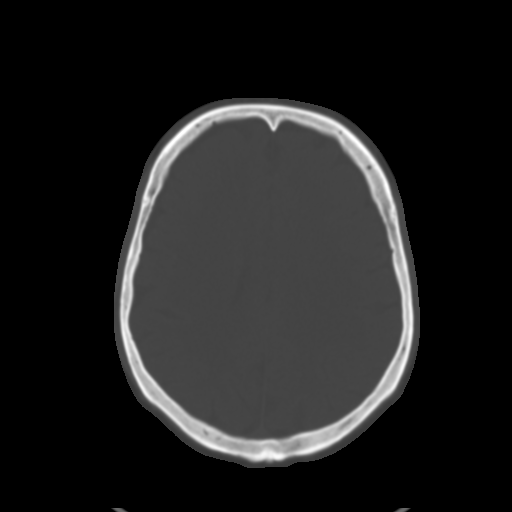
[im 24/32  brain]
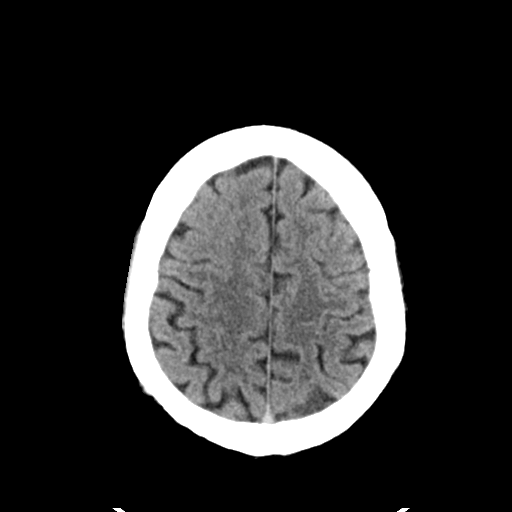
[im 28/32  brain]
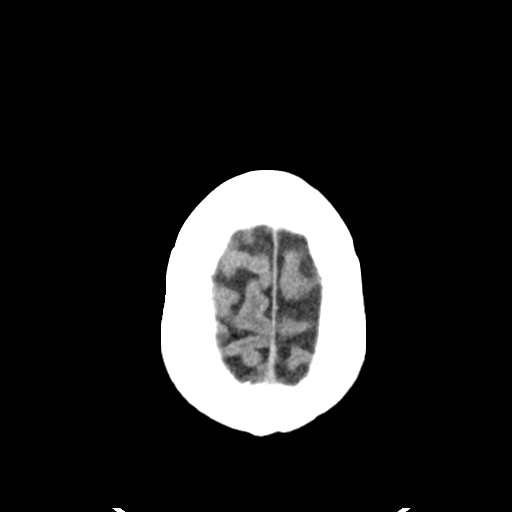

[Series 3: head bone · axial · 0.47mm/px · z∈[+1432,+1464]mm · 3 of 78 slices shown]
[im 8/78  bone]
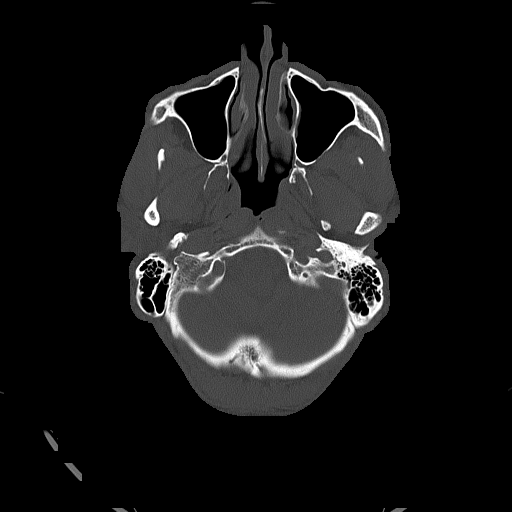
[im 16/78  bone]
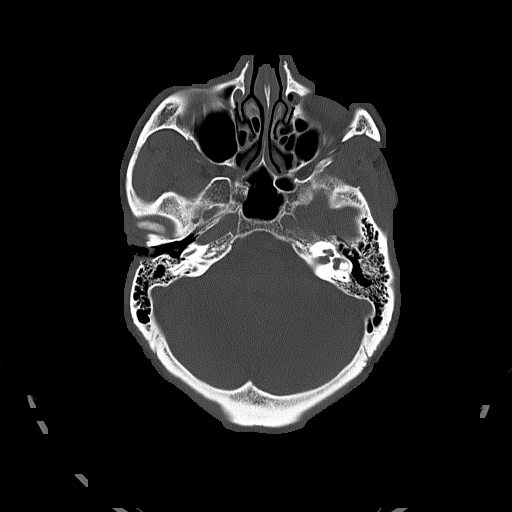
[im 24/78  bone]
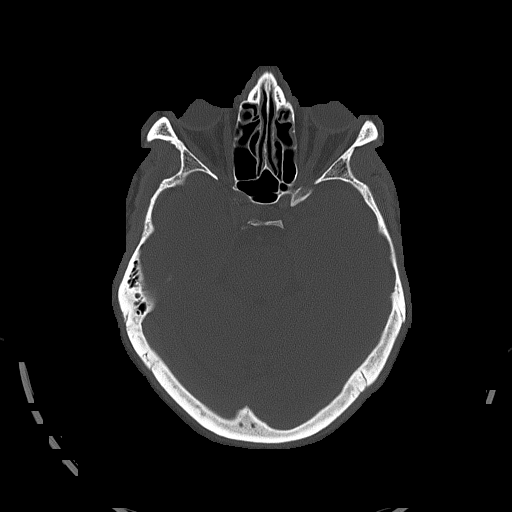

[Series 4: coronal soft tissue · coronal · 0.37mm/px · 3 of 64 slices shown]
[im 22/64  brain]
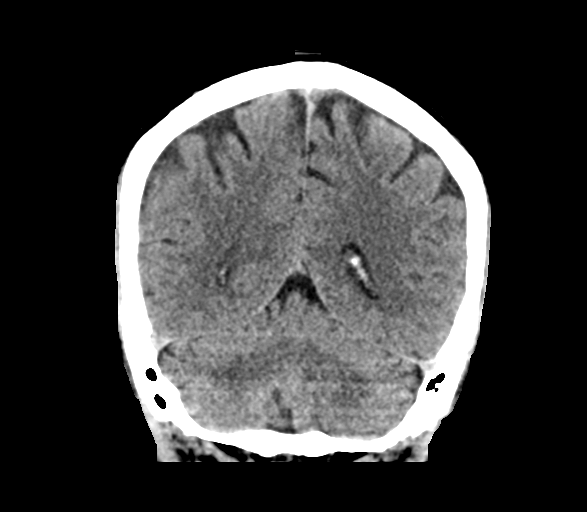
[im 29/64  brain]
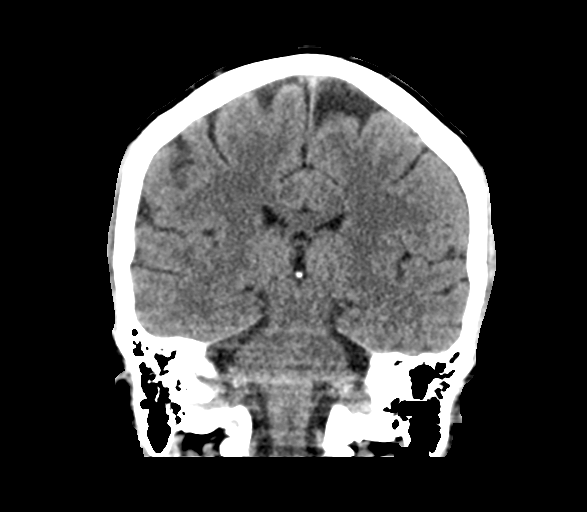
[im 36/64  brain]
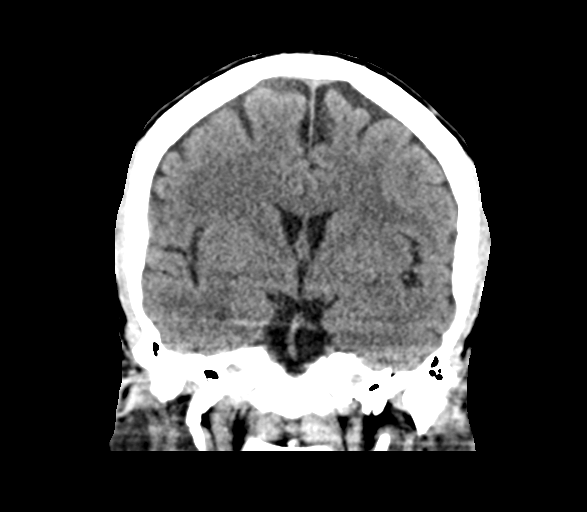

[Series 5: sagittal soft tissue · sagittal · 0.40mm/px · 3 of 55 slices shown]
[im 19/55  brain]
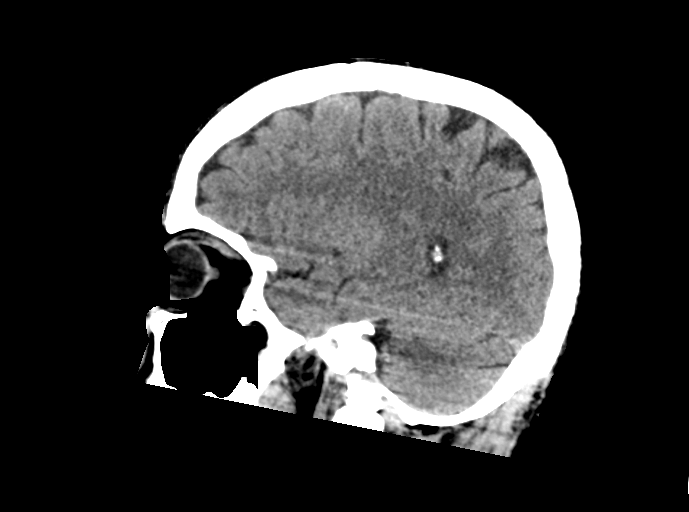
[im 28/55  brain]
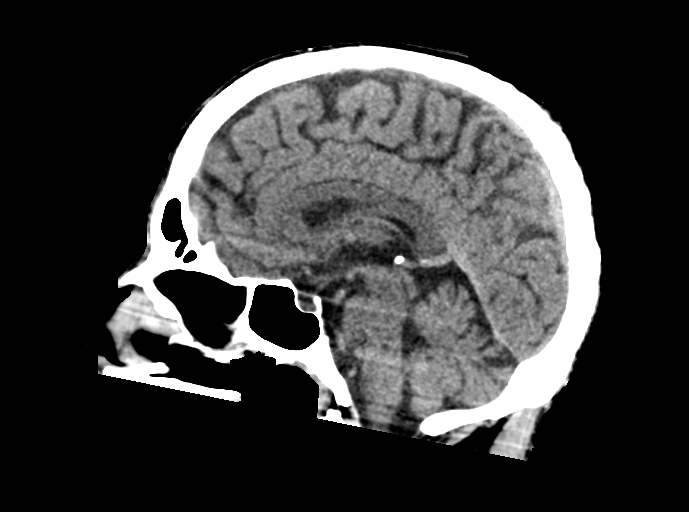
[im 37/55  brain]
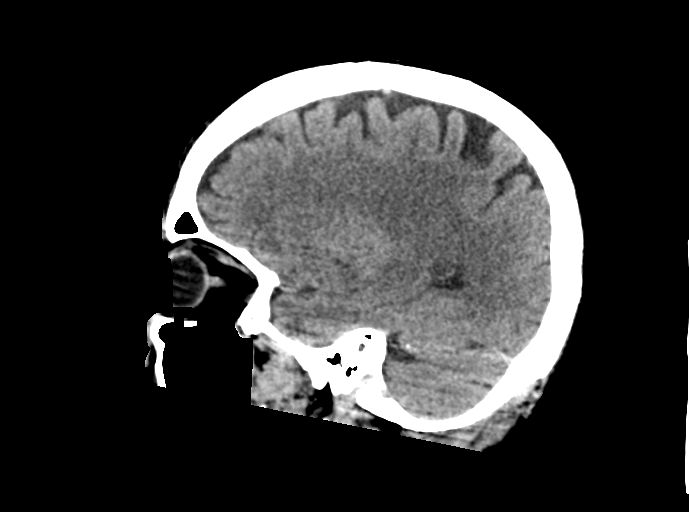

[16 of 47 positions shown; findings below may reference images not displayed]

FINDINGS: Brain:

Cerebral volume is normal.

There is no acute intracranial hemorrhage.

No demarcated cortical infarct.

No extra-axial fluid collection.

No evidence of intracranial mass.

No midline shift.

Vascular: No hyperdense vessel.  Atherosclerotic calcifications

Skull: Normal. Negative for fracture or focal lesion.

Sinuses/Orbits: Visualized orbits show no acute finding. Trace
bilateral ethmoid sinus mucosal thickening. Mild mucosal thickening
within the partially imaged left maxillary sinus.
IMPRESSION: Unremarkable non-contrast CT appearance of the brain. No evidence of
acute intracranial abnormality.

Mild paranasal sinus disease, as described.

## 2023-04-08 ENCOUNTER — Encounter: Payer: Self-pay | Admitting: Family Medicine

## 2023-04-08 ENCOUNTER — Ambulatory Visit (INDEPENDENT_AMBULATORY_CARE_PROVIDER_SITE_OTHER): Payer: Self-pay | Admitting: Family Medicine

## 2023-04-08 VITALS — BP 128/76 | HR 75 | Ht 66.0 in | Wt 159.0 lb

## 2023-04-08 DIAGNOSIS — Z5971 Insufficient health insurance coverage: Secondary | ICD-10-CM

## 2023-04-08 DIAGNOSIS — F333 Major depressive disorder, recurrent, severe with psychotic symptoms: Secondary | ICD-10-CM

## 2023-04-08 DIAGNOSIS — N400 Enlarged prostate without lower urinary tract symptoms: Secondary | ICD-10-CM

## 2023-04-08 MED ORDER — FLUOXETINE HCL 40 MG PO CAPS: 80.0000 mg | ORAL_CAPSULE | Freq: Every day | ORAL | 1 refills | Status: DC

## 2023-04-08 MED ORDER — TAMSULOSIN HCL 0.4 MG PO CAPS: 0.4000 mg | ORAL_CAPSULE | Freq: Every day | ORAL | 0 refills | Status: DC

## 2023-04-08 MED ORDER — RISPERIDONE 0.25 MG PO TBDP: 0.2500 mg | ORAL_TABLET | Freq: Every day | ORAL | 0 refills | Status: DC

## 2023-04-08 MED ORDER — FLUOXETINE HCL 20 MG PO TABS: 20.0000 mg | ORAL_TABLET | Freq: Every day | ORAL | 0 refills | Status: DC

## 2023-04-08 NOTE — Progress Notes (Signed)
    SUBJECTIVE:   CHIEF COMPLAINT / HPI: new patient  PMH:  -Hospitalized in 2022 with major depression with psychosis, was seeing psychiatrist Onoriod Delynn, no insurance to see them anymore -BPH  PSH: -None  Medications: -Risperidone  0.25 mg at bedtime -Prozac  100 mg daily (two 40 mg and one 20 mg) -Tamsulosin  0.4 mg daily -Appears to previously have been on Wellbutrin , Seroquel , trazodone   SH: -Married to wife Geni -Has 5 children: Ages 28,25,21,13,6 -Alcohol: No -Drugs: No -Tobacco: No  FH: -Sister: Breast cancer -21 yr son: Depression -28 yr child: Depression -No insurance, self-pay  OBJECTIVE:   BP 128/76   Pulse 75   Ht 5' 6 (1.676 m)   Wt 159 lb (72.1 kg)   SpO2 99%   BMI 25.66 kg/m   General: Alert and oriented, in NAD Skin: Warm, dry, and intact without lesions HEENT: NCAT, EOM grossly normal, midline nasal septum Cardiac: RRR, no m/r/g appreciated Respiratory: CTAB, breathing and speaking comfortably on RA Abdominal: Soft, nontender, nondistended, normoactive bowel sounds Extremities: Moves all extremities grossly equally Neurological: No gross focal deficit Psychiatric: Appropriate mood and affect, PHQ-9=10 with negative #9  ASSESSMENT/PLAN:   MDD (major depressive disorder), recurrent, severe, with psychosis (HCC) Stable on Prozac  100 daily and risperidone  0.25 mg at bedtime.  Will refill at this time.  Will also send in referral to psychiatry for further management of antipsychotic.  Follow-up in 1 month.  BPH (benign prostatic hyperplasia) Stable on tamsulosin  0.4 mg daily.  Will refill today.  Does not have health insurance Will send in referral to care management team to potentially get patient connected with Medicaid and other financial support for healthcare.   Health maintenance Will address health maintenance items at next visit in 1 month.  Stuart Redo, MD [facility]

## 2023-04-08 NOTE — Patient Instructions (Addendum)
 Reabastecer sus medicamentos. Te he remitido a psiquiatra. Tambin les he pedido a nuestros trabajadores sociales que le ayuden con las finanzas. Vuelva en 1 mes para presenter, broadcasting.  I will refill your medications. I have referred you to psychiatry. I have also asked our social workers to help you with finances. Come back in 1 month to follow up.

## 2023-04-08 NOTE — Assessment & Plan Note (Signed)
 Stable on Prozac 100 daily and risperidone 0.25 mg at bedtime.  Will refill at this time.  Will also send in referral to psychiatry for further management of antipsychotic.  Follow-up in 1 month.

## 2023-04-08 NOTE — Assessment & Plan Note (Signed)
 Stable on tamsulosin 0.4 mg daily.  Will refill today.

## 2023-04-08 NOTE — Assessment & Plan Note (Signed)
 Will send in referral to care management team to potentially get patient connected with Medicaid and other financial support for healthcare.

## 2023-04-09 ENCOUNTER — Telehealth: Payer: Self-pay | Admitting: *Deleted

## 2023-04-09 NOTE — Progress Notes (Signed)
 Complex Care Management Note Care Guide Note  04/09/2023 Name: Darryl Wang MRN: [id number] DOB: 1966/02/26   Complex Care Management Outreach Attempts: An unsuccessful telephone outreach was attempted today to offer the patient information about available complex care management services. Using [employer] PI#[id number] named [name]   Follow Up Plan:  Additional outreach attempts will be made to offer the patient complex care management information and services.   Encounter Outcome:  No Answer  [name]  Care Coordination Care Guide  Direct Dial: [phone]

## 2023-04-15 NOTE — Progress Notes (Signed)
Complex Care Management Note Care Guide Note  04/15/2023 Name: Darryl Wang MRN: [id number] DOB: 1966-01-30   Complex Care Management Outreach Attempts: A third unsuccessful outreach was attempted today to offer the patient with information about available complex care management services.  Follow Up Plan:  No further outreach attempts will be made at this time. We have been unable to contact the patient to offer or enroll patient in complex care management services.  Encounter Outcome:  No Answer  Gwenevere Ghazi  [facility]  [facility], [facility] Guide  Direct Dial: [phone]  Fax [phone]

## 2023-04-16 ENCOUNTER — Telehealth: Payer: Self-pay | Admitting: *Deleted

## 2023-04-16 NOTE — Progress Notes (Signed)
Complex Care Management Note  Care Guide Note 04/16/2023 Name: Brek Gwinn MRN: [id number] DOB: 02-05-1966  Toderick Ziman is a 58 y.o. year old male who sees Evette Georges, MD for primary care. I reached out to Timmothy Sours by phone today to offer complex care management services.  Mr. Bernheim was given information about Complex Care Management services today including:   The Complex Care Management services include support from the care team which includes your Nurse Coordinator, Clinical Social Worker, or Pharmacist.  The Complex Care Management team is here to help remove barriers to the health concerns and goals most important to you. Complex Care Management services are voluntary, and the patient may decline or stop services at any time by request to their care team member.   Complex Care Management Consent Status: Patient agreed to services and verbal consent obtained.   Follow up plan:  Telephone appointment with complex care management team member scheduled for:  04/22/23  Encounter Outcome:  Patient Scheduled  Gwenevere Ghazi  [facility]  [facility], [facility] Guide  Direct Dial: [phone]  Fax [phone]

## 2023-04-22 ENCOUNTER — Telehealth: Payer: Self-pay

## 2023-04-22 ENCOUNTER — Ambulatory Visit: Payer: Self-pay | Admitting: Licensed Clinical Social Worker

## 2023-04-22 NOTE — Telephone Encounter (Signed)
Patients wife calls nurse line in regards to Risperidone prescription.   She reports he has no insurance and the disintegrating tablets are ~300 dollars.   She reports the pharmacy stated the "regular" tablets would be cheaper.   I attempted to call pharmacy to get more information, however no one answered and I have to LVM.   Will forward to PCP.

## 2023-04-22 NOTE — Patient Outreach (Signed)
  Care Coordination   Initial Visit Note   04/22/2023 Name: Darryl Wang MRN: [id number] DOB: [birth year]  Darryl Wang is a 58 y.o. year old male who sees Darryl Georges, MD for primary care. I spoke with  Darryl Wang by phone today via spanish interpretor.   What matters to the patients health and wellness today?  LCSW A Verlin Duke and Pt Darryl Wang completed initial visit via phone. Pt request information for behavioral health providers offering low cost healthcare.     Goals Addressed             This Visit's Progress    Care Coordination       Activities and task to complete in order to accomplish goals.   Call Darryl Wang for integrative BH to schedule your counseling appointment. Continue with compliance of taking medication prescribed by Doctor Self Support options  (contact [facility] for services. )          SDOH assessments and interventions completed:  Yes  SDOH Interventions Today    Flowsheet Row Most Recent Value  SDOH Interventions   Food Insecurity Interventions Intervention Not Indicated  Housing Interventions Intervention Not Indicated  Transportation Interventions Intervention Not Indicated  Utilities Interventions Community Resources Provided  [LIEAP with [facility] DSS]        Care Coordination Interventions:  Yes, provided   Interventions Today    Flowsheet Row Most Recent Value  Chronic Disease   Chronic disease during today's visit Other  [Depression]  Mental Health Interventions   Mental Health Discussed/Reviewed Mental Health Reviewed, Depression  [Pt requested BH providers low cost. Encourage pt to connect with [facility] and [facility].]       Follow up plan: No further intervention required.   Encounter Outcome:  Patient Visit Completed   Gwyndolyn Saxon MSW, LCSW Licensed Clinical Social Worker  [facility], Population  Health Direct Dial: [phone]  Fax: [phone]

## 2023-04-22 NOTE — Patient Instructions (Signed)
Visit Information  Thank you for taking time to visit with me today. Please don't hesitate to contact me if I can be of assistance to you.   Following are the goals we discussed today:   Goals Addressed             This Visit's Progress    Care Coordination       Activities and task to complete in order to accomplish goals.   Call [facility] for integrative BH to schedule your counseling appointment. Continue with compliance of taking medication prescribed by Doctor Self Support options  (contact [facility] for services. )          Please call the care guide team at [phone] if you need to cancel or reschedule your appointment.   If you are experiencing a Mental Health or Behavioral Health Crisis or need someone to talk to, please call 911   Patient verbalizes understanding of instructions and care plan provided today and agrees to view in MyChart. Active MyChart status and patient understanding of how to access instructions and care plan via MyChart confirmed with patient.     Patient has contact information should he have additional questions or concerns.  Gwyndolyn Saxon MSW, LCSW Licensed Clinical Social Worker  [facility], Population Health Direct Dial: [phone]  Fax: [phone]

## 2023-04-23 MED ORDER — RISPERIDONE 0.25 MG PO TABS: 0.2500 mg | ORAL_TABLET | Freq: Every day | ORAL | 1 refills | Status: DC

## 2023-04-23 NOTE — Telephone Encounter (Addendum)
Risperidone 0.25 mg QHS prescription changed from disintegrating tablets to regular tablets and sent to [facility]. Attempted to call patient without answer; LVM asking to call back. Please inform him of this.

## 2023-05-07 ENCOUNTER — Encounter: Payer: Self-pay | Admitting: Family Medicine

## 2023-05-07 ENCOUNTER — Ambulatory Visit (INDEPENDENT_AMBULATORY_CARE_PROVIDER_SITE_OTHER): Payer: Self-pay | Admitting: Family Medicine

## 2023-05-07 VITALS — BP 104/69 | HR 74 | Ht 66.0 in | Wt 154.0 lb

## 2023-05-07 DIAGNOSIS — R7303 Prediabetes: Secondary | ICD-10-CM

## 2023-05-07 DIAGNOSIS — N400 Enlarged prostate without lower urinary tract symptoms: Secondary | ICD-10-CM

## 2023-05-07 DIAGNOSIS — F333 Major depressive disorder, recurrent, severe with psychotic symptoms: Secondary | ICD-10-CM

## 2023-05-07 LAB — POCT GLYCOSYLATED HEMOGLOBIN (HGB A1C): Hemoglobin A1C: 5.9 % — AB (ref 4.0–5.6)

## 2023-05-07 MED ORDER — TAMSULOSIN HCL 0.4 MG PO CAPS: 0.4000 mg | ORAL_CAPSULE | Freq: Every day | ORAL | 1 refills | Status: DC

## 2023-05-07 MED ORDER — FLUOXETINE HCL 20 MG PO TABS: 20.0000 mg | ORAL_TABLET | Freq: Every day | ORAL | 0 refills | Status: DC

## 2023-05-07 MED ORDER — RISPERIDONE 0.25 MG PO TABS: 0.2500 mg | ORAL_TABLET | Freq: Every day | ORAL | 1 refills | Status: DC

## 2023-05-07 MED ORDER — FLUOXETINE HCL 40 MG PO CAPS: 80.0000 mg | ORAL_CAPSULE | Freq: Every day | ORAL | 1 refills | Status: DC

## 2023-05-07 NOTE — Patient Instructions (Addendum)
 I have refilled your medications. Come back in 3 months or sooner if needed. I am happy the medications are working well!  He repuesto sus medicamentos. Vuelva en 3 meses o antes si es necesario. Estoy feliz de [employer] medicamentos estn funcionando bien!

## 2023-05-07 NOTE — Assessment & Plan Note (Signed)
 Stable on current Flomax  regimen.

## 2023-05-07 NOTE — Progress Notes (Signed)
    SUBJECTIVE:   CHIEF COMPLAINT / HPI:   MDD Been doing well since last visit. He is meditating and taking his medications without issue, and he feels like he has more energy.  Has unfortunately not been able to find a psychiatrist given he is without insurance.  BPH Has been taking his flomax  without issue. This continues to help him urinate much better.  PERTINENT  PMH / PSH: has been trying to cut down on carbohydrates and sugars  OBJECTIVE:   BP 104/69   Pulse 74   Ht 5' 6 (1.676 m)   Wt 154 lb (69.9 kg)   SpO2 97%   BMI 24.86 kg/m   General: Alert and oriented, in NAD Skin: Warm, dry, and intact without lesions HEENT: NCAT, EOM grossly normal, midline nasal septum Cardiac: RRR, no m/r/g appreciated Respiratory: CTAB, breathing and speaking comfortably on RA Abdominal: Soft, nontender, nondistended, normoactive bowel sounds Extremities: Moves all extremities grossly equally Neurological: No gross focal deficit Psychiatric: Appropriate mood and affect   ASSESSMENT/PLAN:   Prediabetes A1c in prediabetic range at 5.9 today, down from 6.2 two years ago.  BMI is reassuring; his risperidone  is very likely playing a role.  He is working on lifestyle changes as well.  Can continue to monitor periodically.  MDD (major depressive disorder), recurrent, severe, with psychosis (HCC) Stable on 100 mg Prozac  daily as well as Risperdal  0.25 mg daily.  Difficult to connect with a psychiatrist given lack of insurance.  Discussed further labs for monitoring for metabolic syndrome including CBC, CMP, lipid panel; patient elected to update labs at next visit in 3 months given cost.  BPH (benign prostatic hyperplasia) Stable on current Flomax  regimen.  Health maintenance Unfortunately does not have insurance; given information to get vaccines at health department as desired for free.  He will call to schedule his next visit in 3 months.  Stuart Redo, MD [facility]

## 2023-05-07 NOTE — Assessment & Plan Note (Signed)
 Stable on 100 mg Prozac  daily as well as Risperdal  0.25 mg daily.  Difficult to connect with a psychiatrist given lack of insurance.  Discussed further labs for monitoring for metabolic syndrome including CBC, CMP, lipid panel; patient elected to update labs at next visit in 3 months given cost.

## 2023-05-07 NOTE — Assessment & Plan Note (Signed)
 A1c in prediabetic range at 5.9 today, down from 6.2 two years ago.  BMI is reassuring; his risperidone  is very likely playing a role.  He is working on lifestyle changes as well.  Can continue to monitor periodically.

## 2023-05-16 ENCOUNTER — Other Ambulatory Visit: Payer: Self-pay | Admitting: Family Medicine

## 2023-06-30 ENCOUNTER — Ambulatory Visit (INDEPENDENT_AMBULATORY_CARE_PROVIDER_SITE_OTHER): Payer: Self-pay | Admitting: Student

## 2023-06-30 ENCOUNTER — Encounter: Payer: Self-pay | Admitting: Student

## 2023-06-30 VITALS — BP 117/81 | HR 82 | Ht 66.0 in | Wt 151.2 lb

## 2023-06-30 DIAGNOSIS — F333 Major depressive disorder, recurrent, severe with psychotic symptoms: Secondary | ICD-10-CM

## 2023-06-30 DIAGNOSIS — N400 Enlarged prostate without lower urinary tract symptoms: Secondary | ICD-10-CM

## 2023-06-30 MED ORDER — FLUOXETINE HCL 40 MG PO CAPS: 80.0000 mg | ORAL_CAPSULE | Freq: Every day | ORAL | 1 refills | Status: DC

## 2023-06-30 MED ORDER — RISPERIDONE 0.25 MG PO TABS: 0.2500 mg | ORAL_TABLET | Freq: Every day | ORAL | 1 refills | Status: DC

## 2023-06-30 MED ORDER — FLUOXETINE HCL 20 MG PO TABS: 20.0000 mg | ORAL_TABLET | Freq: Every day | ORAL | 0 refills | Status: DC

## 2023-06-30 MED ORDER — TAMSULOSIN HCL 0.4 MG PO CAPS: 0.4000 mg | ORAL_CAPSULE | Freq: Every day | ORAL | 1 refills | Status: DC

## 2023-06-30 NOTE — Patient Instructions (Signed)
 To meet you today.  Today I have refilled your home medications.  If you have any questions please reach out to [place].

## 2023-06-30 NOTE — Progress Notes (Signed)
    SUBJECTIVE:   CHIEF COMPLAINT / HPI:   58 year old male history of depression and BPH presenting today for medication refills.  Notices good compliance with the medication denies any significant side effects.  Reports medication working well and so far have remained stable.  Refill on his risperidone, fluoxetine and Flomax.  PERTINENT  PMH / PSH: Reviewed   OBJECTIVE:   BP 117/81   Pulse 82   Ht 5\' 6"  (1.676 m)   Wt 151 lb 3.2 oz (68.6 kg)   SpO2 98%   BMI 24.40 kg/m    Physical Exam General: Alert, well appearing, NAD Cardiovascular: RRR, No Murmurs, Normal S2/S2 Respiratory: CTAB, No wheezing or Rales Psych: Pleasant affect, normal judgment.  ASSESSMENT/PLAN:   BPH (benign prostatic hyperplasia) Stable on current regimen of Flomax 0.4 mg daily. -Refilled Flomax  MDD (major depressive disorder), recurrent, severe, with psychosis (HCC) PHQ-9 of 0.  Patient endorses good mood and good tolerance on current regimen.  No SI/HI. He is currently taking 40 fluoxetine twice daily and additional 20 mg to complete 100 mg daily.  However maximum dose for fluoxetine is 80 mg daily.  Reviewed patient's fluoxetine and discontinued the 20 mg. -Reduced fluoxetine to 80 mg daily -Refilled risperidone 0.25 mg daily     Jerre Simon, MD [facility]

## 2023-06-30 NOTE — Assessment & Plan Note (Signed)
 Stable on current regimen of Flomax 0.4 mg daily. -Refilled Flomax

## 2023-06-30 NOTE — Assessment & Plan Note (Signed)
 PHQ-9 of 0.  Patient endorses good mood and good tolerance on current regimen.  No SI/HI. He is currently taking 40 fluoxetine twice daily and additional 20 mg to complete 100 mg daily.  However maximum dose for fluoxetine is 80 mg daily.  Reviewed patient's fluoxetine and discontinued the 20 mg. -Reduced fluoxetine to 80 mg daily -Refilled risperidone 0.25 mg daily

## 2023-08-21 ENCOUNTER — Other Ambulatory Visit: Payer: Self-pay | Admitting: Family Medicine

## 2023-08-21 DIAGNOSIS — F333 Major depressive disorder, recurrent, severe with psychotic symptoms: Secondary | ICD-10-CM

## 2023-09-13 ENCOUNTER — Other Ambulatory Visit: Payer: Self-pay | Admitting: Student

## 2023-09-13 DIAGNOSIS — N400 Enlarged prostate without lower urinary tract symptoms: Secondary | ICD-10-CM

## 2023-09-27 ENCOUNTER — Other Ambulatory Visit: Payer: Self-pay | Admitting: Student

## 2023-09-27 DIAGNOSIS — F333 Major depressive disorder, recurrent, severe with psychotic symptoms: Secondary | ICD-10-CM

## 2023-12-11 ENCOUNTER — Other Ambulatory Visit: Payer: Self-pay | Admitting: Family Medicine

## 2023-12-22 ENCOUNTER — Other Ambulatory Visit: Payer: Self-pay | Admitting: Family Medicine

## 2023-12-29 ENCOUNTER — Other Ambulatory Visit: Payer: Self-pay

## 2023-12-29 DIAGNOSIS — F333 Major depressive disorder, recurrent, severe with psychotic symptoms: Secondary | ICD-10-CM

## 2023-12-29 MED ORDER — FLUOXETINE HCL 40 MG PO CAPS: 80.0000 mg | ORAL_CAPSULE | Freq: Every day | ORAL | 1 refills | Status: DC

## 2024-01-06 ENCOUNTER — Other Ambulatory Visit (HOSPITAL_COMMUNITY): Payer: Self-pay

## 2024-01-06 ENCOUNTER — Other Ambulatory Visit: Payer: Self-pay

## 2024-01-06 ENCOUNTER — Ambulatory Visit: Payer: Self-pay | Admitting: Student

## 2024-01-06 VITALS — BP 116/74 | HR 72 | Ht 66.0 in | Wt 154.4 lb

## 2024-01-06 DIAGNOSIS — Z1159 Encounter for screening for other viral diseases: Secondary | ICD-10-CM

## 2024-01-06 DIAGNOSIS — Z0000 Encounter for general adult medical examination without abnormal findings: Secondary | ICD-10-CM

## 2024-01-06 DIAGNOSIS — Z1211 Encounter for screening for malignant neoplasm of colon: Secondary | ICD-10-CM

## 2024-01-06 DIAGNOSIS — Z125 Encounter for screening for malignant neoplasm of prostate: Secondary | ICD-10-CM

## 2024-01-06 DIAGNOSIS — Z131 Encounter for screening for diabetes mellitus: Secondary | ICD-10-CM

## 2024-01-06 DIAGNOSIS — F333 Major depressive disorder, recurrent, severe with psychotic symptoms: Secondary | ICD-10-CM

## 2024-01-06 DIAGNOSIS — Z13228 Encounter for screening for other metabolic disorders: Secondary | ICD-10-CM

## 2024-01-06 DIAGNOSIS — Z13220 Encounter for screening for lipoid disorders: Secondary | ICD-10-CM

## 2024-01-06 MED ORDER — FLUOXETINE HCL 20 MG PO TABS
20.0000 mg | ORAL_TABLET | Freq: Every day | ORAL | 1 refills | Status: AC
  Filled 2024-01-06 (×2): qty 90, 90d supply, fill #0
  Filled 2024-04-03: qty 90, 90d supply, fill #1

## 2024-01-06 MED ORDER — FLUOXETINE HCL 40 MG PO CAPS
80.0000 mg | ORAL_CAPSULE | Freq: Every day | ORAL | 1 refills | Status: AC
  Filled 2024-01-06 (×2): qty 180, 90d supply, fill #0
  Filled 2024-04-03: qty 180, 90d supply, fill #1

## 2024-01-06 NOTE — Progress Notes (Signed)
    SUBJECTIVE:   Chief compliant/HPI: annual examination  Darryl Wang is a 58 y.o. who presents today for an annual exam.   History tabs reviewed and updated .   OBJECTIVE:   BP 116/74   Pulse 72   Ht 5' 6 (1.676 m)   Wt 154 lb 6.4 oz (70 kg)   SpO2 97%   BMI 24.92 kg/m    General: NAD, pleasant Cardio: RRR, no MRG. Cap Refill <2s. Respiratory: CTAB, normal wob on RA GI: Abdomen is soft, not tender, not distended. BS present Skin: Warm and dry  ASSESSMENT/PLAN:   Assessment & Plan Annual physical exam See AVS for age appropriate recommendations.  PHQ score 0, reviewed and discussed.  Blood pressure value is at goal, discussed.  Considered the following screening exams based upon USPSTF recommendations: Diabetes screening: ordered HIV testing:Not discussed Hepatitis C: ordered Hepatitis B:recently completed and result reviewed, normal  Syphilis if at high risk: Not high risk GC/CT not high risk Lipid panel (nonfasting or fasting) discussed based upon AHA recommendations and ordered.  Consider repeat every 4-6 years.  Cancer Screening Discussion  Lung cancer screening:not indicated as does not meet criteria. See documentation below regarding indications/risks/benefits. Colorectal cancer screening: discussed, colonoscopy ordered. PSA discussed and after engaging in discussion of possible risks, benefits and complications of screening. PSA: requested  Follow up in 1 year or sooner if indicated.  MyChart Activation: Signed up today   Follow up in 1 year or sooner if indicated.  MyChart Activation: Signed up today MDD (major depressive disorder), recurrent, severe, with psychosis (HCC) -Well-controlled - Requesting longer refills of fluoxetine , currently on 100 mg (unfortunately due to formulary he is has to take TWO 40 mg tablets and  ONE 20 mg tablet daily)  Gladis Church, DO [facility]

## 2024-01-06 NOTE — Patient Instructions (Signed)
 It was great to see you! Thank you for allowing me to participate in your care!   I recommend that you always bring your medications to each appointment as this makes it easy to ensure we are on the correct medications and helps us  not miss when refills are needed.  Our plans for today:  - I have refilled your Fluoxetine  for 90 day supply - Please follow-up for your lab visit - you will receive a call to schedule colonoscopy  Take care and seek immediate care sooner if you develop any concerns. Please remember to show up 15 minutes before your scheduled appointment time!  Gladis Church, DO [facility]

## 2024-01-06 NOTE — Assessment & Plan Note (Addendum)
-  Well-controlled - Requesting longer refills of fluoxetine , currently on 100 mg (unfortunately due to formulary he is has to take TWO 40 mg tablets and  ONE 20 mg tablet daily)

## 2024-01-07 ENCOUNTER — Other Ambulatory Visit: Payer: Self-pay

## 2024-01-10 ENCOUNTER — Other Ambulatory Visit: Payer: Self-pay

## 2024-01-11 ENCOUNTER — Ambulatory Visit: Payer: Self-pay | Admitting: Family Medicine

## 2024-01-12 ENCOUNTER — Other Ambulatory Visit (INDEPENDENT_AMBULATORY_CARE_PROVIDER_SITE_OTHER): Payer: Self-pay

## 2024-01-12 ENCOUNTER — Ambulatory Visit: Payer: Self-pay | Admitting: Family Medicine

## 2024-01-12 DIAGNOSIS — Z13228 Encounter for screening for other metabolic disorders: Secondary | ICD-10-CM

## 2024-01-12 DIAGNOSIS — Z125 Encounter for screening for malignant neoplasm of prostate: Secondary | ICD-10-CM

## 2024-01-12 DIAGNOSIS — Z13220 Encounter for screening for lipoid disorders: Secondary | ICD-10-CM

## 2024-01-12 DIAGNOSIS — Z131 Encounter for screening for diabetes mellitus: Secondary | ICD-10-CM

## 2024-01-12 DIAGNOSIS — Z1159 Encounter for screening for other viral diseases: Secondary | ICD-10-CM

## 2024-01-12 LAB — POCT GLYCOSYLATED HEMOGLOBIN (HGB A1C): HbA1c, POC (controlled diabetic range): 5.9 % (ref 0.0–7.0)

## 2024-01-13 LAB — BASIC METABOLIC PANEL WITH GFR
BUN/Creatinine Ratio: 14 (ref 9–20)
BUN: 11 mg/dL (ref 6–24)
CO2: 21 mmol/L (ref 20–29)
Calcium: 9.7 mg/dL (ref 8.7–10.2)
Chloride: 102 mmol/L (ref 96–106)
Creatinine, Ser: 0.78 mg/dL (ref 0.76–1.27)
Glucose: 107 mg/dL — ABNORMAL HIGH (ref 70–99)
Potassium: 4.6 mmol/L (ref 3.5–5.2)
Sodium: 138 mmol/L (ref 134–144)
eGFR: 105 mL/min/1.73 (ref >59)

## 2024-01-13 LAB — LIPID PANEL
Chol/HDL Ratio: 4.4 ratio (ref 0.0–5.0)
Cholesterol, Total: 225 mg/dL — ABNORMAL HIGH (ref 100–199)
HDL: 51 mg/dL (ref >39)
LDL Chol Calc (NIH): 151 mg/dL — ABNORMAL HIGH (ref 0–99)
Triglycerides: 129 mg/dL (ref 0–149)
VLDL Cholesterol Cal: 23 mg/dL (ref 5–40)

## 2024-01-13 LAB — PSA: Prostate Specific Ag, Serum: 1 ng/mL (ref 0.0–4.0)

## 2024-01-13 LAB — HEPATITIS C ANTIBODY: Hep C Virus Ab: NONREACTIVE

## 2024-02-17 ENCOUNTER — Other Ambulatory Visit: Payer: Self-pay

## 2024-03-02 ENCOUNTER — Other Ambulatory Visit: Payer: Self-pay

## 2024-03-02 ENCOUNTER — Other Ambulatory Visit: Payer: Self-pay | Admitting: Family Medicine

## 2024-03-02 DIAGNOSIS — F333 Major depressive disorder, recurrent, severe with psychotic symptoms: Secondary | ICD-10-CM

## 2024-03-02 DIAGNOSIS — N400 Enlarged prostate without lower urinary tract symptoms: Secondary | ICD-10-CM

## 2024-03-02 MED ORDER — RISPERIDONE 0.25 MG PO TABS
0.2500 mg | ORAL_TABLET | Freq: Every day | ORAL | 0 refills | Status: AC
  Filled 2024-03-02: qty 90, 90d supply, fill #0

## 2024-03-02 MED ORDER — TAMSULOSIN HCL 0.4 MG PO CAPS
0.4000 mg | ORAL_CAPSULE | Freq: Every day | ORAL | 3 refills | Status: AC
  Filled 2024-03-02: qty 90, 90d supply, fill #0

## 2024-03-03 ENCOUNTER — Other Ambulatory Visit: Payer: Self-pay

## 2024-03-07 ENCOUNTER — Other Ambulatory Visit: Payer: Self-pay

## 2024-04-03 ENCOUNTER — Other Ambulatory Visit: Payer: Self-pay
